# Patient Record
Sex: Male | Born: 1950 | Race: White | Hispanic: No | Smoking: Former smoker
Health system: Southern US, Community
[De-identification: ages and names within clinical notes are randomized; demographics above are authoritative.]

## PROBLEM LIST (undated history)

## (undated) DIAGNOSIS — E785 Hyperlipidemia, unspecified: Secondary | ICD-10-CM

## (undated) DIAGNOSIS — G43909 Migraine, unspecified, not intractable, without status migrainosus: Secondary | ICD-10-CM

## (undated) DIAGNOSIS — K2100 Gastro-esophageal reflux disease with esophagitis, without bleeding: Secondary | ICD-10-CM

## (undated) DIAGNOSIS — I77811 Abdominal aortic ectasia: Secondary | ICD-10-CM

## (undated) HISTORY — PX: SHOULDER SURGERY: SHX246

## (undated) HISTORY — DX: Gastro-esophageal reflux disease with esophagitis: K21.0

## (undated) HISTORY — PX: HERNIA REPAIR: SHX51

## (undated) HISTORY — PX: BACK SURGERY: SHX140

## (undated) HISTORY — DX: Gastro-esophageal reflux disease with esophagitis, without bleeding: K21.00

## (undated) HISTORY — DX: Migraine, unspecified, not intractable, without status migrainosus: G43.909

## (undated) HISTORY — PX: LAMINECTOMY: SHX219

## (undated) HISTORY — DX: Hyperlipidemia, unspecified: E78.5

---
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
## 2002-07-22 ENCOUNTER — Encounter: Payer: Self-pay | Admitting: Pediatrics

## 2002-07-22 ENCOUNTER — Ambulatory Visit (HOSPITAL_COMMUNITY): Admission: RE | Admit: 2002-07-22 | Discharge: 2002-07-22 | Payer: Self-pay | Admitting: Pediatrics

## 2005-04-23 HISTORY — PX: COLONOSCOPY: SHX174

## 2006-02-18 ENCOUNTER — Ambulatory Visit (HOSPITAL_COMMUNITY): Admission: RE | Admit: 2006-02-18 | Discharge: 2006-02-18 | Payer: Self-pay | Admitting: Pediatrics

## 2006-03-06 ENCOUNTER — Ambulatory Visit: Payer: Self-pay | Admitting: Internal Medicine

## 2006-03-06 ENCOUNTER — Ambulatory Visit (HOSPITAL_COMMUNITY): Admission: RE | Admit: 2006-03-06 | Discharge: 2006-03-06 | Payer: Self-pay | Admitting: Internal Medicine

## 2006-03-26 ENCOUNTER — Encounter: Admission: RE | Admit: 2006-03-26 | Discharge: 2006-03-26 | Payer: Self-pay | Admitting: Neurosurgery

## 2006-03-28 ENCOUNTER — Encounter: Admission: RE | Admit: 2006-03-28 | Discharge: 2006-03-28 | Payer: Self-pay | Admitting: Neurosurgery

## 2006-04-18 ENCOUNTER — Encounter: Admission: RE | Admit: 2006-04-18 | Discharge: 2006-04-18 | Payer: Self-pay | Admitting: Neurosurgery

## 2006-07-05 ENCOUNTER — Encounter: Admission: RE | Admit: 2006-07-05 | Discharge: 2006-07-05 | Payer: Self-pay | Admitting: Neurosurgery

## 2006-09-24 ENCOUNTER — Encounter: Admission: RE | Admit: 2006-09-24 | Discharge: 2006-09-24 | Payer: Self-pay | Admitting: Neurosurgery

## 2006-10-14 ENCOUNTER — Encounter: Admission: RE | Admit: 2006-10-14 | Discharge: 2006-10-14 | Payer: Self-pay | Admitting: Neurosurgery

## 2007-02-07 ENCOUNTER — Encounter: Admission: RE | Admit: 2007-02-07 | Discharge: 2007-02-07 | Payer: Self-pay | Admitting: Neurosurgery

## 2007-04-22 ENCOUNTER — Encounter: Admission: RE | Admit: 2007-04-22 | Discharge: 2007-04-22 | Payer: Self-pay | Admitting: Neurosurgery

## 2008-10-28 ENCOUNTER — Encounter: Admission: RE | Admit: 2008-10-28 | Discharge: 2008-10-28 | Payer: Self-pay | Admitting: Chiropractic Medicine

## 2008-11-24 ENCOUNTER — Ambulatory Visit (HOSPITAL_COMMUNITY): Admission: RE | Admit: 2008-11-24 | Discharge: 2008-11-25 | Payer: Self-pay | Admitting: Neurosurgery

## 2009-04-23 HISTORY — PX: BACK SURGERY: SHX140

## 2010-05-14 ENCOUNTER — Encounter: Payer: Self-pay | Admitting: Neurosurgery

## 2010-07-30 LAB — CBC
HCT: 39.4 % (ref 39.0–52.0)
Hemoglobin: 13.4 g/dL (ref 13.0–17.0)
MCV: 90.9 fL (ref 78.0–100.0)
Platelets: 235 10*3/uL (ref 150–400)
RBC: 4.33 MIL/uL (ref 4.22–5.81)
WBC: 11 10*3/uL — ABNORMAL HIGH (ref 4.0–10.5)

## 2010-07-30 LAB — BASIC METABOLIC PANEL
BUN: 16 mg/dL (ref 6–23)
Chloride: 102 mEq/L (ref 96–112)
GFR calc non Af Amer: >60 mL/min (ref >60)
Potassium: 3.5 mEq/L (ref 3.5–5.1)
Sodium: 139 mEq/L (ref 135–145)

## 2010-09-05 NOTE — H&P (Signed)
NAME:  SILAS, MUFF                 ACCOUNT NO.:  [id number]   MEDICAL RECORD NO.:  [id number]          PATIENT TYPE:  OIB   LOCATION:  3537                         FACILITY:  [facility]   PHYSICIAN:  Hilda Lias, M.D.   DATE OF BIRTH:  Aug 16, 1950   DATE OF ADMISSION:  11/24/2008  DATE OF DISCHARGE:                              HISTORY & PHYSICAL   HISTORY OF PRESENT ILLNESS:  Mr. Vanatta is a gentleman who came to my  office with his wife complaining of back pain with radiation down to the  left leg, which is getting worse despite conservative treatment, which  includes episode of injection and medication including p.o. steroid.  The patient in the past had problem with the right leg.  In view of no  improvement, he had an MRI, and he wanted to proceed with surgery.   PAST MEDICAL HISTORY:  Left inguinal hernia repair and shoulder surgery.   ALLERGIES:  He is allergic to PENICILLIN.   SOCIAL HISTORY:  The patient does not smoke.  He drinks socially.   REVIEW OF SYSTEMS:  Positive for high cholesterol and left leg pain.   PHYSICAL EXAMINATION:  GENERAL:  The patient came to my office, limping  from the left leg.  He had difficulty standing.  He wanted to have  surgery almost immediately.  HEAD, EARS, NOSE, AND THROAT:  Normal.  NECK:  Normal.  LUNGS:  Clear.  HEART:  Heart sounds normal.  ABDOMEN:  Normal.  EXTREMITIES:  Normal pulses.  NEUROLOGIC:  Mental status normal.  Cranial nerves normal.  He has  weakness with dorsiflexion of the left foot.  The face is symmetrical.  Straight leg raising is positive at 30 degrees from the left side.   The MRI showed that he has a herniated disk at the level of L4-5 to the  left.  He also has facet arthropathy, L3-4, L4-5, and L5-S1 with some  degenerative disk disease.   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  Degenerative disk disease.   RECOMMENDATIONS:  The patient being admitted for surgery.  The  procedure  will be left L4-L5 diskectomy.  The patient knows about the risk such as  infection, CSF leak, worsening pain, and need for further surgery.           ______________________________  Hilda Lias, M.D.     EB/MEDQ  D:  11/24/2008  T:  11/25/2008  Job:  [id number]

## 2010-09-05 NOTE — Op Note (Signed)
NAME:  Dylan Chapman, Dylan Chapman                 ACCOUNT NO.:  [id number]   MEDICAL RECORD NO.:  [id number]          PATIENT TYPE:  OIB   LOCATION:  3537                         FACILITY:  [facility]   PHYSICIAN:  Hilda Lias, M.D.   DATE OF BIRTH:  [birth year]   DATE OF PROCEDURE:  11/24/2008  DATE OF DISCHARGE:                               OPERATIVE REPORT   PREOPERATIVE DIAGNOSIS:  Left L4-L5 herniated disk.   FINAL DIAGNOSIS:  Left L4-L5 herniated disk with free fragment.   PROCEDURE:  Left L4-L5 laminotomy, removal of 5 free fragments,  decompression of the L5 and L4 nerve root, microscope.   SURGEON:  Hilda Lias, MD   ASSISTANT:  Coletta Memos, MD   CLINICAL HISTORY:  [name] is a __________ who came to my office  complaining of back pain with radiation to the left leg.  The patient  was getting worse.  He failed with conservative treatment including  epidural injection.  MRI showed fragment compromising the L5 nerve root.  Surgery was advised.   PROCEDURE:  The patient was taken to the OR, after intubation, he was  positioned in a prone manner.  The back was cleaned with DuraPrep.  A  midline incision from L4 to L5 was made and muscles were retracted in  the left side.  The x-ray showed that indeed we were at the level L4-5.  Then with the drill and use of the microscope, we drilled the lower  lamina of L4 and above L5.  A thick yellow ligament was also removed.  Immediately, we found that the L5 nerve root was adherent to the floor,  swelling and red.  Dissection was done and immediately, right  underneath, there were at least 5 fragments of this prior to take off of  L5 nerve root.  We found a small area where the disk was opened.  We  __________ use of bipolar to harden the area.  Foraminotomy was  accomplished.  At the end, we have plain roof of the L5 and L4 nerve  root.  No diskectomy was needed.  Then, Valsalva maneuver was negative.  Fentanyl and Depo-Medrol were left in  the epidural space, and the wound  was closed with Vicryl and Steri-Strips.           ______________________________  Hilda Lias, M.D.     EB/MEDQ  D:  11/24/2008  T:  11/25/2008  Job:  [id number]

## 2010-09-08 NOTE — Op Note (Signed)
NAME:  Dylan Chapman, Dylan Chapman                 ACCOUNT NO.:  [id number]   MEDICAL RECORD NO.:  [id number]          PATIENT TYPE:  AMB   LOCATION:  DAY                           FACILITY:  [facility]   PHYSICIAN:  R. Roetta Sessions, M.D. DATE OF BIRTH:  [birth year]   DATE OF PROCEDURE:  03/06/2006  DATE OF DISCHARGE:                                 OPERATIVE REPORT   PROCEDURE:  Screening colonoscopy.   INDICATIONS FOR PROCEDURE:  Patient is a 59 year old gentleman sent over per  the courtesy of Dr. Acey Lav for colorectal cancer screening via  colonoscopy.  He is devoid of any lower GI tract symptoms.  He has never had  his colon imaged previously, and there is no family history of colorectal  neoplasia.  Colonoscopy is now being done.  This approach has been discussed  with the patient at length.  Potential risks, benefits and alternatives have  been reviewed and questions answered.  He is agreeable.  Please see  documentation on the medical record.   PROCEDURE NOTE:  O2 saturation, blood pressure, pulses, and respirations  were monitored throughout the entire procedure.  Conscious sedation with  Versed 4 mg IV and Demerol 75 mg in divided doses.   INSTRUMENT:  Olympus video chip system.   FINDINGS:  Digital rectal exam revealed no abnormalities.   ENDOSCOPIC FINDINGS:  Prep was good.   RECTAL:  Examination of the rectal mucosa, including retroflexion of the  anal verge, revealed no abnormalities.   COLON:  The colonic mucosa was surveyed from the rectosigmoid junction to  the left transverse, right colon, the appendiceal orifice, the ileocecal  valve, and cecum.  These structures were well seen and photographed for the  record.  From this level, the scope was slowly withdrawn and all previously  mentioned mucosal surfaces were again seen.  The patient had scattered  sigmoid diverticulum, and colonic mucosa appeared normal.  The patient  tolerated the procedure well and was reactive.   ENDOSCOPY IMPRESSION:  1. Normal rectum.  2. Scattered sigmoid diverticulum.  Colonic mucosa appeared normal.   RECOMMENDATIONS:  1. Diverticulosis literature provided to [name].  2. Repeat screening colonoscopy 10 years.      [name], M.D.  Electronically Signed     RMR/MEDQ  D:  03/06/2006  T:  03/06/2006  Job:  [id number]   cc:   Francoise Schaumann. Milford Cage DO, FAAP  Fax: [phone]

## 2012-06-28 ENCOUNTER — Encounter: Payer: Self-pay | Admitting: *Deleted

## 2013-02-18 ENCOUNTER — Telehealth: Payer: Self-pay | Admitting: Family Medicine

## 2013-02-18 NOTE — Telephone Encounter (Signed)
Dylan Chapman has a medical question for you if you could give him a call at your soonest availability.

## 2013-02-19 ENCOUNTER — Telehealth: Payer: Self-pay | Admitting: General Practice

## 2013-02-19 NOTE — Telephone Encounter (Signed)
Message copied by Jennings Books on Thu Feb 19, 2013  8:33 AM ------      Message from: Diana Eves D      Created: Wed Feb 18, 2013  3:22 PM       Pt called to see how much a TCS would be since he know longer has insurance. He isn't due until 02/2016 but he was asking also if there was any financial assistance or payment plan available. I gave him the number to the billing office, but also said if he would like to apply for [facility] Assistance that I would have my office manager get back with him. Please call him at [phone] ------

## 2013-02-19 NOTE — Telephone Encounter (Signed)
I also told the patient that his next tcs was not due until 02/2016.  He thanked me for my time and stated he will call me if he needed anything.

## 2013-02-19 NOTE — Telephone Encounter (Signed)
I spoke with Dylan Chapman concerning pricing for colonoscopy.

## 2013-02-20 NOTE — Telephone Encounter (Signed)
Patient wondered if it was safe to wait to 2017 for next colonoscopy. Records were reviewed last colonoscopy 2007 and it does state followup colonoscopy 10 years later. Therefore colonoscopy in 2017 patient is aware of this and agrees. Patient not having any problems currently

## 2013-07-22 ENCOUNTER — Other Ambulatory Visit: Payer: Self-pay | Admitting: Family Medicine

## 2013-07-22 ENCOUNTER — Other Ambulatory Visit: Payer: Self-pay | Admitting: *Deleted

## 2013-07-22 ENCOUNTER — Telehealth: Payer: Self-pay | Admitting: Family Medicine

## 2013-07-22 DIAGNOSIS — E785 Hyperlipidemia, unspecified: Secondary | ICD-10-CM

## 2013-07-22 DIAGNOSIS — Z125 Encounter for screening for malignant neoplasm of prostate: Secondary | ICD-10-CM

## 2013-07-22 DIAGNOSIS — Z79899 Other long term (current) drug therapy: Secondary | ICD-10-CM

## 2013-07-22 NOTE — Telephone Encounter (Signed)
Notified patient stating blood work orders are in.

## 2013-07-22 NOTE — Telephone Encounter (Signed)
Medication sent patient notified. 

## 2013-07-22 NOTE — Addendum Note (Signed)
Addended byCharolotte Capuchin D on: 07/22/2013 04:25 PM   Modules accepted: Orders

## 2013-07-22 NOTE — Telephone Encounter (Signed)
[facility] patient. Has not been seen in over a year.   The medication is:                           KETO/RIBOF/CAFF 12.5/100/    #60    As directed

## 2013-07-22 NOTE — Telephone Encounter (Signed)
Ok times one 

## 2013-07-22 NOTE — Telephone Encounter (Signed)
Patient would like orders for BW before his appt on 4/23

## 2013-07-22 NOTE — Telephone Encounter (Signed)
Patient needs new prescription on compund that he gets for headaches from [facility] prescription number is [phone] patient has appointment to be seen on 4/23 that the first available apointment that dr. [name] has for medication check. [place]

## 2013-07-22 NOTE — Telephone Encounter (Signed)
Lip liv m7 psa 

## 2013-07-24 LAB — LIPID PANEL
CHOL/HDL RATIO: 3.2 ratio
CHOLESTEROL: 185 mg/dL (ref 0–200)
HDL: 58 mg/dL (ref >39)
LDL Cholesterol: 112 mg/dL — ABNORMAL HIGH (ref 0–99)
TRIGLYCERIDES: 73 mg/dL (ref <150)
VLDL: 15 mg/dL (ref 0–40)

## 2013-07-24 LAB — BASIC METABOLIC PANEL
BUN: 23 mg/dL (ref 6–23)
CHLORIDE: 104 meq/L (ref 96–112)
CO2: 27 mEq/L (ref 19–32)
CREATININE: 1.26 mg/dL (ref 0.50–1.35)
Calcium: 9.6 mg/dL (ref 8.4–10.5)
Glucose, Bld: 100 mg/dL — ABNORMAL HIGH (ref 70–99)
Potassium: 4.8 mEq/L (ref 3.5–5.3)
Sodium: 141 mEq/L (ref 135–145)

## 2013-07-24 LAB — HEPATIC FUNCTION PANEL
ALT: 13 U/L (ref 0–53)
AST: 16 U/L (ref 0–37)
Albumin: 4.2 g/dL (ref 3.5–5.2)
Alkaline Phosphatase: 64 U/L (ref 39–117)
BILIRUBIN DIRECT: 0.1 mg/dL (ref 0.0–0.3)
BILIRUBIN TOTAL: 0.8 mg/dL (ref 0.2–1.2)
Indirect Bilirubin: 0.7 mg/dL (ref 0.2–1.2)
Total Protein: 6.8 g/dL (ref 6.0–8.3)

## 2013-07-25 LAB — PSA: PSA: 2.18 ng/mL (ref <=4.00)

## 2013-08-13 ENCOUNTER — Encounter: Payer: Self-pay | Admitting: Family Medicine

## 2013-08-13 ENCOUNTER — Ambulatory Visit (INDEPENDENT_AMBULATORY_CARE_PROVIDER_SITE_OTHER): Payer: Self-pay | Admitting: Family Medicine

## 2013-08-13 VITALS — BP 118/82 | Ht 66.5 in | Wt 157.0 lb

## 2013-08-13 DIAGNOSIS — R7309 Other abnormal glucose: Secondary | ICD-10-CM

## 2013-08-13 DIAGNOSIS — M549 Dorsalgia, unspecified: Secondary | ICD-10-CM

## 2013-08-13 DIAGNOSIS — R739 Hyperglycemia, unspecified: Secondary | ICD-10-CM

## 2013-08-13 LAB — POCT GLYCOSYLATED HEMOGLOBIN (HGB A1C): HEMOGLOBIN A1C: 5.2

## 2013-08-13 MED ORDER — SIMVASTATIN 40 MG PO TABS: 40.0000 mg | ORAL_TABLET | Freq: Every evening | ORAL | Status: DC

## 2013-08-13 NOTE — Progress Notes (Signed)
   Subjective:    Patient ID: Dylan Chapman, male    DOB: 05-24-1950, 63 y.o.   MRN: [id number]  HPI  Patient arrives for a yearly physical. Patient states he has flares of back pain at times. The patient has intermittent flares of back pain does not radiate down the leg no severe sciatica with it no loss of muscle control. Denies weakness numbness. Denies chest tightness pressure pain shortness breath with activity Wife recently diagnosed with cancer they're going through difficult time Denies hematuria. Denies joint pain. Occasionally he gets cramps in his legs. Review of Systems  Constitutional: Negative for fever, activity change and appetite change.  HENT: Negative for congestion and rhinorrhea.   Eyes: Negative for discharge.  Respiratory: Negative for cough and wheezing.   Cardiovascular: Negative for chest pain.  Gastrointestinal: Negative for vomiting, abdominal pain and blood in stool.  Genitourinary: Negative for frequency and difficulty urinating.  Musculoskeletal: Positive for back pain. Negative for neck pain.  Skin: Negative for rash.  Allergic/Immunologic: Negative for environmental allergies and food allergies.  Neurological: Negative for weakness and headaches.  Psychiatric/Behavioral: Negative for agitation.       Objective:   Physical Exam  Constitutional: He appears well-developed and well-nourished.  HENT:  Head: Normocephalic and atraumatic.  Right Ear: External ear normal.  Left Ear: External ear normal.  Nose: Nose normal.  Mouth/Throat: Oropharynx is clear and moist.  Eyes: EOM are normal. Pupils are equal, round, and reactive to light.  Neck: Normal range of motion. Neck supple. No thyromegaly present.  Cardiovascular: Normal rate, regular rhythm and normal heart sounds.   No murmur heard. Pulmonary/Chest: Effort normal and breath sounds normal. No respiratory distress. He has no wheezes.  Abdominal: Soft. Bowel sounds are normal. He exhibits no  distension and no mass. There is no tenderness.  Genitourinary: Penis normal.  Musculoskeletal: Normal range of motion. He exhibits no edema.  Lymphadenopathy:    He has no cervical adenopathy.  Neurological: He is alert. He exhibits normal muscle tone.  Skin: Skin is warm and dry. No erythema.  Psychiatric: He has a normal mood and affect. His behavior is normal. Judgment normal.          Assessment & Plan:  The patient comes in today for a wellness visit.  A review of their health history was completed.  A review of medications was also completed. Any necessary refills were discussed. Sensible healthy diet was discussed. Importance of minimizing excessive salt and carbohydrates was also discussed. Safety was stressed including driving, activities at work and at home where applicable. Importance of regular physical activity for overall health was discussed. Preventative measures appropriate for age were discussed. Time was spent with the patient discussing any concerns they have about their well-being. Back stretches, ibuprofen when necessary no x-rays indicated. Recent lab work including metabolic 7 as well as PSA looks good Not due for colonoscopy currently until November 2017

## 2013-08-24 ENCOUNTER — Encounter: Payer: Self-pay | Admitting: Family Medicine

## 2013-08-24 ENCOUNTER — Ambulatory Visit (INDEPENDENT_AMBULATORY_CARE_PROVIDER_SITE_OTHER): Payer: Self-pay | Admitting: Family Medicine

## 2013-08-24 VITALS — BP 140/90 | Ht 66.5 in | Wt 154.0 lb

## 2013-08-24 DIAGNOSIS — J069 Acute upper respiratory infection, unspecified: Secondary | ICD-10-CM

## 2013-08-24 DIAGNOSIS — J029 Acute pharyngitis, unspecified: Secondary | ICD-10-CM

## 2013-08-24 LAB — POCT RAPID STREP A (OFFICE): Rapid Strep A Screen: NEGATIVE

## 2013-08-24 MED ORDER — SULFAMETHOXAZOLE-TMP DS 800-160 MG PO TABS
1.0000 | ORAL_TABLET | Freq: Two times a day (BID) | ORAL | Status: DC
Start: 2013-08-24 — End: 2014-03-11

## 2013-08-24 NOTE — Progress Notes (Signed)
   Subjective:    Patient ID: Dylan Chapman, male    DOB: [birth year], 63 y.o.   MRN: [id number]  Sinusitis This is a new problem. Episode onset: Wed. Associated symptoms include congestion, coughing, headaches, sinus pressure and a sore throat. Past treatments include nothing.    Had headache  /cluster headache, took Inderal it helped Review of Systems  HENT: Positive for congestion, sinus pressure and sore throat.   Respiratory: Positive for cough.   Neurological: Positive for headaches.       Objective:   Physical Exam Throat minimal erythema neck is supple lungs are clear sinus nontender no crackles       Assessment & Plan:  Rapid strep negative Viral process supportive measures discussed. Followup if progressive troubles prescription for Bactrim given may use if symptoms worsen warning signs and what to watch for was discussed  Patient does need a refill on Inderal he's not sure the dose he will forward that dose to [place] then we will call in prescription think

## 2013-08-25 LAB — STREP A DNA PROBE: GASP: NEGATIVE

## 2013-08-26 ENCOUNTER — Other Ambulatory Visit: Payer: Self-pay | Admitting: Family Medicine

## 2013-08-26 MED ORDER — PROPRANOLOL HCL 80 MG PO TABS: 80.0000 mg | ORAL_TABLET | Freq: Three times a day (TID) | ORAL | Status: DC

## 2013-08-26 NOTE — Progress Notes (Signed)
Pt requested refill, relates uses it for cluster headaches

## 2013-11-04 ENCOUNTER — Other Ambulatory Visit: Payer: Self-pay | Admitting: Family Medicine

## 2013-11-04 NOTE — Telephone Encounter (Signed)
Not on med list. Last seen 5/4

## 2013-11-04 NOTE — Telephone Encounter (Signed)
May refill x4 

## 2013-11-14 ENCOUNTER — Other Ambulatory Visit: Payer: Self-pay | Admitting: Family Medicine

## 2014-03-11 ENCOUNTER — Ambulatory Visit (INDEPENDENT_AMBULATORY_CARE_PROVIDER_SITE_OTHER): Payer: Self-pay | Admitting: Family Medicine

## 2014-03-11 ENCOUNTER — Encounter: Payer: Self-pay | Admitting: Family Medicine

## 2014-03-11 VITALS — BP 138/90 | Temp 98.6°F | Ht 66.5 in | Wt 165.0 lb

## 2014-03-11 DIAGNOSIS — M545 Low back pain: Secondary | ICD-10-CM

## 2014-03-11 MED ORDER — CHLORZOXAZONE 500 MG PO TABS: 500.0000 mg | ORAL_TABLET | Freq: Four times a day (QID) | ORAL | Status: DC | PRN

## 2014-03-11 NOTE — Progress Notes (Signed)
   Subjective:    Patient ID: Dylan Chapman, male    DOB: [birth year], 63 y.o.   MRN: [id number]  Back Pain This is a recurrent problem. The current episode started more than 1 year ago. The pain is present in the thoracic spine. Quality: spasm. The pain does not radiate. The symptoms are aggravated by position, sitting and twisting. He has tried NSAIDs for the symptoms. The treatment provided no relief.   No sciatica Very tender Trigger- some lifting last week, hand truck lifting 2 days ago Worse with certain movements Hx lumbar surgery Patient relates that the pain is sometimes bad enough to cause him the buckle at the knees when he tries to move  He has had some intermittent numbness into the feet but denies any loss of control or straining  Review of Systems  Musculoskeletal: Positive for back pain.       Objective:   Physical Exam  Lumbar pain on physical exam slight increased pain in the back with straight leg raise on the right none on the left strengthen legs good reflexes good range of motion subpar      Assessment & Plan:  May need steroids  if need hydrocodone he will call  May use anti-inflammatory over the course of next several days, muscle relaxers as necessary cautioned drowsiness, range of motion exercises shown. No need for MRI or scan this time

## 2014-03-17 ENCOUNTER — Telehealth: Payer: Self-pay | Admitting: Family Medicine

## 2014-03-17 MED ORDER — PREDNISONE 20 MG PO TABS: ORAL_TABLET | ORAL | Status: DC

## 2014-03-17 NOTE — Telephone Encounter (Signed)
Ok adult pred taper 

## 2014-03-17 NOTE — Telephone Encounter (Signed)
Patient notified

## 2014-03-17 NOTE — Telephone Encounter (Signed)
Pt calling to say his back is not better, he states that Dr [name] said that he  Was to call back there is a standing order in his OV from 11/19 that says he will call in a  Steroids    [facility]

## 2014-05-27 ENCOUNTER — Ambulatory Visit (INDEPENDENT_AMBULATORY_CARE_PROVIDER_SITE_OTHER): Payer: Self-pay | Admitting: Family Medicine

## 2014-05-27 ENCOUNTER — Encounter: Payer: Self-pay | Admitting: Family Medicine

## 2014-05-27 VITALS — BP 104/72 | Temp 98.6°F | Ht 66.5 in | Wt 160.4 lb

## 2014-05-27 DIAGNOSIS — J208 Acute bronchitis due to other specified organisms: Secondary | ICD-10-CM

## 2014-05-27 MED ORDER — AZITHROMYCIN 250 MG PO TABS: ORAL_TABLET | ORAL | Status: DC

## 2014-05-27 NOTE — Progress Notes (Signed)
   Subjective:    Patient ID: Dylan Chapman, male    DOB: [birth year], 64 y.o.   MRN: [id number]  URI  This is a new problem. The current episode started 1 to 4 weeks ago. The problem has been unchanged. There has been no fever. Associated symptoms include congestion, coughing, a sore throat and wheezing. He has tried decongestant for the symptoms. The treatment provided no relief.   Patient states that he has no other concerns at this time.  PMH benign  Review of Systems  HENT: Positive for congestion and sore throat.   Respiratory: Positive for cough and wheezing.    see below     Objective:   Physical Exam Lungs are clear except when he does extended exhalation he does have some wheezing with it. He denies any high fever or chills. Denies nausea vomiting.       Assessment & Plan:  Persistent bronchitis-it is getting better compared where was encouraged him to give it another week or 2 a prescription for Zithromax given in case it does get worse over the next week he can get that filled. No need for any x-rays or lab work currently.  He will do his wellness later this year and do his lab work beforehand

## 2014-06-10 ENCOUNTER — Other Ambulatory Visit: Payer: Self-pay | Admitting: Family Medicine

## 2014-06-10 MED ORDER — SULFAMETHOXAZOLE-TRIMETHOPRIM 800-160 MG PO TABS: 1.0000 | ORAL_TABLET | Freq: Two times a day (BID) | ORAL | Status: DC

## 2014-06-10 NOTE — Progress Notes (Signed)
The patient called stating that he was still having intermittent chest congestion coughing sometimes bringing up clear phlegm sometimes discolored phlegm this is been going on for several weeks he is noticed only some improvement compared to when he was treated with Zithromax 14 days ago. I have informed the patient that I thought it was best for him to go ahead and do a round of antibiotics. If he has any signs of an allergic reaction to the medicine he is to stop it. He is also to contact us in 2 weeks if not doing better. I do not feel the patient needs to do a chest x-ray currently but that could possibly need to be done depending on what  Goes on.

## 2014-08-09 ENCOUNTER — Telehealth: Payer: Self-pay | Admitting: Family Medicine

## 2014-08-09 DIAGNOSIS — Z79899 Other long term (current) drug therapy: Secondary | ICD-10-CM

## 2014-08-09 DIAGNOSIS — E785 Hyperlipidemia, unspecified: Secondary | ICD-10-CM

## 2014-08-09 DIAGNOSIS — Z125 Encounter for screening for malignant neoplasm of prostate: Secondary | ICD-10-CM

## 2014-08-09 NOTE — Telephone Encounter (Signed)
Pt called requesting lab orders to be sent over. Last labs per epic were: Hepatic,lipid,bmp,and psa on 07/22/13

## 2014-08-09 NOTE — Telephone Encounter (Signed)
Expand All Collapse All   Pt called requesting lab orders to be sent over. Last labs per epic were: Hepatic,lipid,bmp,and psa on 07/22/13

## 2014-08-09 NOTE — Telephone Encounter (Signed)
Lipid, liver, metabolic 7, PSA. These are the standard test. If he has anything else going on to let us know we will order additional tests.

## 2014-08-10 NOTE — Telephone Encounter (Signed)
Blood work orders placed in Epic. Patient notified. 

## 2014-08-12 LAB — BASIC METABOLIC PANEL
BUN / CREAT RATIO: 20 (ref 10–22)
BUN: 23 mg/dL (ref 8–27)
CO2: 25 mmol/L (ref 18–29)
CREATININE: 1.14 mg/dL (ref 0.76–1.27)
Calcium: 9.5 mg/dL (ref 8.6–10.2)
Chloride: 105 mmol/L (ref 97–108)
GFR, EST AFRICAN AMERICAN: 79 mL/min/{1.73_m2} (ref >59)
GFR, EST NON AFRICAN AMERICAN: 69 mL/min/{1.73_m2} (ref >59)
GLUCOSE: 99 mg/dL (ref 65–99)
Potassium: 5 mmol/L (ref 3.5–5.2)
Sodium: 144 mmol/L (ref 134–144)

## 2014-08-12 LAB — LIPID PANEL
CHOLESTEROL TOTAL: 193 mg/dL (ref 100–199)
Chol/HDL Ratio: 3 ratio units (ref 0.0–5.0)
HDL: 64 mg/dL (ref >39)
LDL CALC: 116 mg/dL — AB (ref 0–99)
TRIGLYCERIDES: 63 mg/dL (ref 0–149)
VLDL Cholesterol Cal: 13 mg/dL (ref 5–40)

## 2014-08-12 LAB — HEPATIC FUNCTION PANEL
ALBUMIN: 4.3 g/dL (ref 3.6–4.8)
ALT: 13 IU/L (ref 0–44)
AST: 20 IU/L (ref 0–40)
Alkaline Phosphatase: 70 IU/L (ref 39–117)
BILIRUBIN TOTAL: 0.5 mg/dL (ref 0.0–1.2)
BILIRUBIN, DIRECT: 0.14 mg/dL (ref 0.00–0.40)
TOTAL PROTEIN: 6.6 g/dL (ref 6.0–8.5)

## 2014-08-12 LAB — PSA: PSA: 2.9 ng/mL (ref 0.0–4.0)

## 2014-08-18 ENCOUNTER — Encounter: Payer: Self-pay | Admitting: Family Medicine

## 2014-08-18 ENCOUNTER — Ambulatory Visit (INDEPENDENT_AMBULATORY_CARE_PROVIDER_SITE_OTHER): Payer: Self-pay | Admitting: Family Medicine

## 2014-08-18 VITALS — BP 128/70 | Ht 66.5 in | Wt 159.0 lb

## 2014-08-18 DIAGNOSIS — Z0000 Encounter for general adult medical examination without abnormal findings: Secondary | ICD-10-CM

## 2014-08-18 DIAGNOSIS — R05 Cough: Secondary | ICD-10-CM

## 2014-08-18 DIAGNOSIS — R059 Cough, unspecified: Secondary | ICD-10-CM

## 2014-08-18 MED ORDER — SIMVASTATIN 80 MG PO TABS: ORAL_TABLET | ORAL | Status: DC

## 2014-08-18 NOTE — Progress Notes (Signed)
   Subjective:    Patient ID: Dylan Chapman, male    DOB: 08/29/1950, 64 y.o.   MRN: [id number]  HPI The patient comes in today for a wellness visit.    A review of their health history was completed.  A review of medications was also completed.  Any needed refills: none  Eating habits: decent  Falls/  MVA accidents in past few months: none  Regular exercise: stretching exercises and walking 6 days a week   Specialist pt sees on regular basis: none  Preventative health issues were discussed.   Additional concerns: Talk about tetanus and shingles vaccine. Patient has congestion in his lungs that has been present for a while now.    Review of Systems  Constitutional: Negative for fever, activity change and appetite change.  HENT: Negative for congestion and rhinorrhea.   Eyes: Negative for discharge.  Respiratory: Negative for cough and wheezing.   Cardiovascular: Negative for chest pain.  Gastrointestinal: Negative for vomiting, abdominal pain and blood in stool.  Genitourinary: Negative for frequency and difficulty urinating.  Musculoskeletal: Negative for neck pain.  Skin: Negative for rash.  Allergic/Immunologic: Negative for environmental allergies and food allergies.  Neurological: Negative for weakness and headaches.  Psychiatric/Behavioral: Negative for agitation.       Objective:   Physical Exam  Constitutional: He appears well-developed and well-nourished.  HENT:  Head: Normocephalic and atraumatic.  Right Ear: External ear normal.  Left Ear: External ear normal.  Nose: Nose normal.  Mouth/Throat: Oropharynx is clear and moist.  Eyes: EOM are normal. Pupils are equal, round, and reactive to light.  Neck: Normal range of motion. Neck supple. No thyromegaly present.  Cardiovascular: Normal rate, regular rhythm and normal heart sounds.   No murmur heard. Pulmonary/Chest: Effort normal and breath sounds normal. No respiratory distress. He has no wheezes.    Abdominal: Soft. Bowel sounds are normal. He exhibits no distension and no mass. There is no tenderness.  Genitourinary: Penis normal.  Musculoskeletal: Normal range of motion. He exhibits no edema.  Lymphadenopathy:    He has no cervical adenopathy.  Neurological: He is alert. He exhibits normal muscle tone.  Skin: Skin is warm and dry. No erythema.  Psychiatric: He has a normal mood and affect. His behavior is normal. Judgment normal.   Headache issues under good control he uses a compound from local pharmacy [facility] uses it up to 3 times a week  ssafety dietary measures all discussed. Patient up-to-date on colonoscopy. No hematuria no chest tightness pressure pain shortness of breath  His lungs are clear but he has had some mild cough over the past several weeks since having a bronchitis but is gradually clearing up I would recommend if he starts having night sweats fevers chills weight loss for no good reason or progressive coughing a chest x-ray    Assessment & Plan:  Fatigue probably related to age increase exercise follow-up if ongoing troubles  Importance of regular physical activity watching diet discussed continue medication he takes a half of 80 mg Zocor  Recent lab work looks good repeated again in one years time  I did recommend tetanus shot and shingles vaccine he will probably get this through the pharmacy

## 2014-09-17 ENCOUNTER — Other Ambulatory Visit: Payer: Self-pay | Admitting: Family Medicine

## 2014-09-28 ENCOUNTER — Telehealth: Payer: Self-pay | Admitting: Family Medicine

## 2014-09-28 ENCOUNTER — Encounter: Payer: Self-pay | Admitting: *Deleted

## 2014-09-28 ENCOUNTER — Other Ambulatory Visit: Payer: Self-pay | Admitting: *Deleted

## 2014-09-28 NOTE — Telephone Encounter (Signed)
Ketoprofen 25 mg 1 twice a day, #60, 6 refills, [place], put on the prescription as a note to the pharmacist-to put the prescription on file until patient requests the prescription( it should be noted that I had a discussion with the patient was frequent headaches. He wanted to have this in and he will use it infrequently with caffeine and vitamin B2 for his migraines he is found this helps. He states he takes it anywhere between 1 and 2 times per week )

## 2014-09-28 NOTE — Telephone Encounter (Signed)
Unable to send through epic. Paper script sent to [facility]. Med added on med list.

## 2014-09-28 NOTE — Telephone Encounter (Signed)
Patient called in today and said he was given an e-mail to contact one of nurses about a medication but he misplaced the e-mail.  He says that Dr. [name] told him to contact the nurses this way for his Rx issue.  Please advise.

## 2015-07-05 ENCOUNTER — Telehealth: Payer: Self-pay | Admitting: Family Medicine

## 2015-07-05 DIAGNOSIS — Z131 Encounter for screening for diabetes mellitus: Secondary | ICD-10-CM

## 2015-07-05 DIAGNOSIS — E785 Hyperlipidemia, unspecified: Secondary | ICD-10-CM

## 2015-07-05 DIAGNOSIS — Z125 Encounter for screening for malignant neoplasm of prostate: Secondary | ICD-10-CM

## 2015-07-05 DIAGNOSIS — Z79899 Other long term (current) drug therapy: Secondary | ICD-10-CM

## 2015-07-05 DIAGNOSIS — R5383 Other fatigue: Secondary | ICD-10-CM

## 2015-07-05 NOTE — Telephone Encounter (Signed)
Lipid, liver, glucose, PSA, CBC

## 2015-07-05 NOTE — Telephone Encounter (Signed)
Patient has physical on 4/27 and needing lab papers. He is wanting to pick them up. He goes to [facility] which was [place].

## 2015-07-05 NOTE — Telephone Encounter (Signed)
Blood work ordered-orders up front for patient pick up. Patient notified.

## 2015-08-02 ENCOUNTER — Other Ambulatory Visit: Payer: Self-pay | Admitting: Family Medicine

## 2015-08-02 LAB — CBC WITH DIFFERENTIAL/PLATELET
BASOS ABS: 47 {cells}/uL (ref 0–200)
Basophils Relative: 1 %
Eosinophils Absolute: 141 cells/uL (ref 15–500)
Eosinophils Relative: 3 %
HCT: 43 % (ref 38.5–50.0)
HEMOGLOBIN: 14.3 g/dL (ref 13.2–17.1)
Lymphocytes Relative: 31 %
Lymphs Abs: 1457 cells/uL (ref 850–3900)
MCH: 29.4 pg (ref 27.0–33.0)
MCHC: 33.3 g/dL (ref 32.0–36.0)
MCV: 88.5 fL (ref 80.0–100.0)
MONO ABS: 423 {cells}/uL (ref 200–950)
MPV: 10.2 fL (ref 7.5–12.5)
Monocytes Relative: 9 %
NEUTROS PCT: 56 %
Neutro Abs: 2632 cells/uL (ref 1500–7800)
Platelets: 289 10*3/uL (ref 140–400)
RBC: 4.86 MIL/uL (ref 4.20–5.80)
RDW: 13.4 % (ref 11.0–15.0)
WBC: 4.7 10*3/uL (ref 3.8–10.8)

## 2015-08-02 LAB — HEPATIC FUNCTION PANEL
ALT: 11 U/L (ref 9–46)
AST: 18 U/L (ref 10–35)
Albumin: 4 g/dL (ref 3.6–5.1)
Alkaline Phosphatase: 56 U/L (ref 40–115)
BILIRUBIN INDIRECT: 0.5 mg/dL (ref 0.2–1.2)
BILIRUBIN TOTAL: 0.6 mg/dL (ref 0.2–1.2)
Bilirubin, Direct: 0.1 mg/dL (ref <=0.2)
Total Protein: 6.4 g/dL (ref 6.1–8.1)

## 2015-08-02 LAB — LIPID PANEL
Cholesterol: 168 mg/dL (ref 125–200)
HDL: 55 mg/dL (ref >=40)
LDL CALC: 101 mg/dL (ref <130)
Total CHOL/HDL Ratio: 3.1 Ratio (ref <=5.0)
Triglycerides: 61 mg/dL (ref <150)
VLDL: 12 mg/dL (ref <30)

## 2015-08-02 LAB — GLUCOSE, RANDOM: Glucose, Bld: 98 mg/dL (ref 65–99)

## 2015-08-03 LAB — PSA: PSA: 2.48 ng/mL (ref <=4.00)

## 2015-08-17 ENCOUNTER — Encounter: Payer: Self-pay | Admitting: Family Medicine

## 2015-08-18 ENCOUNTER — Encounter: Payer: Self-pay | Admitting: Family Medicine

## 2015-08-18 ENCOUNTER — Ambulatory Visit (INDEPENDENT_AMBULATORY_CARE_PROVIDER_SITE_OTHER): Payer: Self-pay | Admitting: Family Medicine

## 2015-08-18 VITALS — BP 122/74 | Ht 66.25 in | Wt 163.0 lb

## 2015-08-18 DIAGNOSIS — G8929 Other chronic pain: Secondary | ICD-10-CM | POA: Insufficient documentation

## 2015-08-18 DIAGNOSIS — E785 Hyperlipidemia, unspecified: Secondary | ICD-10-CM

## 2015-08-18 DIAGNOSIS — R519 Headache, unspecified: Secondary | ICD-10-CM | POA: Insufficient documentation

## 2015-08-18 DIAGNOSIS — Z0000 Encounter for general adult medical examination without abnormal findings: Secondary | ICD-10-CM

## 2015-08-18 DIAGNOSIS — R51 Headache: Secondary | ICD-10-CM

## 2015-08-18 MED ORDER — SIMVASTATIN 80 MG PO TABS: ORAL_TABLET | ORAL | Status: DC

## 2015-08-18 MED ORDER — TRIAMCINOLONE ACETONIDE 0.1 % EX CREA: 1.0000 "application " | TOPICAL_CREAM | Freq: Two times a day (BID) | CUTANEOUS | Status: DC

## 2015-08-18 NOTE — Progress Notes (Signed)
   Subjective:    Patient ID: Dylan Chapman, male    DOB: 1950-06-02, 65 y.o.   MRN: [id number]  HPI The patient comes in today for a wellness visit.    A review of their health history was completed.  A review of medications was also completed.  Any needed refills; simvastatin to [facility]. And compound for headaches done at [facility].    Eating habits: health conscious  Falls/  MVA accidents in past few months: none  Regular exercise: none  Specialist pt sees on regular basis: none  Preventative health issues were discussed.   Additional concerns: refill on triamcinolone cream. Last filled by DR. Ham. Still has tube but has expired.  No recent falls moods are good   Review of Systems    patient denies chest pains headaches nausea vomiting diarrhea rectal bleeding hematuria denies joint pain Objective:   Physical Exam Neck no masses skin looks good lungs are clear no crackles heart regular no murmurs pulse normal BP good abdomen soft prostate exam normal extremities no edema Cognitive function paced he passes mini cog. He can name 16 animals in 1 minute      Assessment & Plan:  Safety dietary measures discussed. The importance of eating healthy.  Hyperlipidemia lipid profile looks good he is taking a half of a simvastatin tablet daily  Chronic headaches stable uses a combination of ketoprofen as well as be vitamin complex through [facility]  Tolerates 81 mg aspirin daily denies a causing any trouble  Patient does have fungal infection of the toenails but this is normal for age it is not severe if he did choose Lamisil he would have to do liver profiles on a monthly basis patient defers on this currently-secondly I don't recommend the medication  Patient defers on colonoscopy which he will get it next year when he has Medicare. He denies any symptoms of colon cancer and currently does not have any insurance

## 2015-09-05 DIAGNOSIS — Z029 Encounter for administrative examinations, unspecified: Secondary | ICD-10-CM

## 2015-09-14 ENCOUNTER — Ambulatory Visit (INDEPENDENT_AMBULATORY_CARE_PROVIDER_SITE_OTHER): Payer: Self-pay | Admitting: Family Medicine

## 2015-09-14 ENCOUNTER — Encounter: Payer: Self-pay | Admitting: Family Medicine

## 2015-09-14 VITALS — BP 130/80 | Temp 98.6°F | Ht 66.25 in | Wt 162.2 lb

## 2015-09-14 DIAGNOSIS — J020 Streptococcal pharyngitis: Secondary | ICD-10-CM

## 2015-09-14 MED ORDER — AZITHROMYCIN 250 MG PO TABS: ORAL_TABLET | ORAL | Status: DC

## 2015-09-14 NOTE — Progress Notes (Signed)
   Subjective:    Patient ID: Dylan Chapman, male    DOB: 12/21/1950, 65 y.o.   MRN: [id number]  HPI Patient arrives with c/o sore throat- wife is currently being treated for strep and patient is needing treatment. Patient explores exposed to strep having some soreness in the throat denies high fever chills sweats no nausea vomiting diarrhea  Review of Systems     Objective:   Physical Exam Throat erythematous neck without masses lungs clear heart regular       Assessment & Plan:  Strep throat noted antibiotic prescribed warning signs discussed follow-up if problems

## 2016-02-06 ENCOUNTER — Telehealth: Payer: Self-pay | Admitting: Internal Medicine

## 2016-02-06 NOTE — Telephone Encounter (Signed)
Recall for tcs °

## 2016-02-06 NOTE — Telephone Encounter (Signed)
Letter mailed

## 2016-04-10 ENCOUNTER — Other Ambulatory Visit: Payer: Self-pay | Admitting: Family Medicine

## 2016-04-13 ENCOUNTER — Encounter: Payer: Self-pay | Admitting: Nurse Practitioner

## 2016-04-13 ENCOUNTER — Ambulatory Visit (INDEPENDENT_AMBULATORY_CARE_PROVIDER_SITE_OTHER): Payer: Self-pay | Admitting: Nurse Practitioner

## 2016-04-13 VITALS — BP 126/80 | Temp 98.3°F | Ht 66.25 in | Wt 170.1 lb

## 2016-04-13 DIAGNOSIS — J069 Acute upper respiratory infection, unspecified: Secondary | ICD-10-CM

## 2016-04-13 DIAGNOSIS — B9689 Other specified bacterial agents as the cause of diseases classified elsewhere: Secondary | ICD-10-CM

## 2016-04-13 MED ORDER — AZITHROMYCIN 250 MG PO TABS: ORAL_TABLET | ORAL | 0 refills | Status: DC

## 2016-04-14 ENCOUNTER — Encounter: Payer: Self-pay | Admitting: Nurse Practitioner

## 2016-04-14 NOTE — Progress Notes (Signed)
Subjective:  Presents for c/o sinus and cough x 3 d. No fever. Wife treated for similar illness yesterday. Sore throat. Headache. Runny nose. Frequent cough producing green sputum. No ear pain. No wheezing.   Objective:   BP 126/80   Temp 98.3 F (36.8 C) (Oral)   Ht 5' 6.25" (1.683 m)   Wt 170 lb 2 oz (77.2 kg)   BMI 27.25 kg/m  NAD. Alert, oriented. TMs clear effusion. Pharynx injected with PND noted. Neck supple with mild anterior adenopathy. Lungs clear. Heart RRR.   Assessment: Bacterial upper respiratory infection  Plan:  Meds ordered this encounter  Medications  . azithromycin (ZITHROMAX Z-PAK) 250 MG tablet    Sig: Take 2 tablets (500 mg) on  Day 1,  followed by 1 tablet (250 mg) once daily on Days 2 through 5.    Dispense:  6 each    Refill:  0    Order Specific Question:   Supervising Provider    Answer:   Mikey Kirschner [[id number]]   OTC meds as directed. Call back if worsens or persists.

## 2016-07-23 ENCOUNTER — Telehealth: Payer: Self-pay | Admitting: Family Medicine

## 2016-07-23 DIAGNOSIS — Z125 Encounter for screening for malignant neoplasm of prostate: Secondary | ICD-10-CM

## 2016-07-23 DIAGNOSIS — E785 Hyperlipidemia, unspecified: Secondary | ICD-10-CM

## 2016-07-23 NOTE — Telephone Encounter (Signed)
Lipid, liver, metabolic 7, PSA 

## 2016-07-23 NOTE — Telephone Encounter (Signed)
Diagnosis his hyperlipidemia and screening

## 2016-07-23 NOTE — Telephone Encounter (Signed)
Wants to have lab work done for his physical coming up in May. Going to [facility]. No hurry, please call when orders are ready.

## 2016-07-24 NOTE — Telephone Encounter (Signed)
Spoke with patient and informed him per [place] have been ordered for upcoming physical in May. Please be fasting prior to get labs drawn. Patient verbalized understanding.

## 2016-08-06 ENCOUNTER — Ambulatory Visit (INDEPENDENT_AMBULATORY_CARE_PROVIDER_SITE_OTHER): Payer: Self-pay | Admitting: Family Medicine

## 2016-08-06 ENCOUNTER — Encounter: Payer: Self-pay | Admitting: Family Medicine

## 2016-08-06 VITALS — Ht 66.25 in

## 2016-08-06 DIAGNOSIS — J029 Acute pharyngitis, unspecified: Secondary | ICD-10-CM

## 2016-08-06 DIAGNOSIS — R070 Pain in throat: Secondary | ICD-10-CM

## 2016-08-06 LAB — POCT RAPID STREP A (OFFICE): Rapid Strep A Screen: NEGATIVE

## 2016-08-06 NOTE — Progress Notes (Signed)
   Subjective:    Patient ID: Dylan Chapman, male    DOB: Oct 11, 1950, 66 y.o.   MRN: [id number]  Sore Throat   This is a new problem. The current episode started in the past 7 days. Associated symptoms comments: dysphagia. He has tried nothing for the symptoms.   No fever chills sweats no weight loss. Food does not get stuck but he feels a constant discomfort for the past week and a half and is had off and on discomfort in the laryngeal area for the past several weeks occasional hoarseness. Does not smoke or drink   Review of Systems Please see above no weight loss no night sweats. No fevers. No wheezing or difficulty breathing.    Objective:   Physical Exam Neck no masses eardrums normal throat minimal redness Rapid strep negative       Assessment & Plan:  Discomfort with swallowing Laryngeal discomfort Possible related to reflux issues-we will give the medication a two-week trial he still needs to see ENT for further evaluation if symptomatology is not improved with the medication there is concern that could be other issues going on. The patient is aware he needs to see ENT possibly for laryngoscope and or CAT scan Omeprazole 20 mg daily Check CBC Referral to ENT for further evaluation patient needs laryngeal examination possible CAT scans

## 2016-08-06 NOTE — Patient Instructions (Signed)
Omeprazole 20 mg , otc, one daily

## 2016-08-07 LAB — STREP A DNA PROBE: Strep Gp A Direct, DNA Probe: NEGATIVE

## 2016-08-13 ENCOUNTER — Encounter: Payer: Self-pay | Admitting: Family Medicine

## 2016-08-20 ENCOUNTER — Telehealth: Payer: Self-pay | Admitting: Family Medicine

## 2016-08-20 NOTE — Telephone Encounter (Signed)
Spoke with patient and patient stated that he has an appointment on May 17th with Dr.Teoh here is [place]. Stated that he is willing to move his appointment up but it depends of when it is so that it can work with his work schedule. Informed him per Dr.Giordano Luking to continue medications. Patient verbalized understanding.

## 2016-08-20 NOTE — Telephone Encounter (Signed)
The patient is concerned about whether or not omeprazole needs to be continued. Has appointment with ENT around the middle of the month. Nurse's-please find out from the patient is his appointment at the local ENT branch or in [place]? If there is a cancellation would you be willing to move this up? For now I recommend for the patient to continue the medication-it is helpful for the ENT to know that the patient has tried this.

## 2016-08-21 ENCOUNTER — Telehealth: Payer: Self-pay | Admitting: Family Medicine

## 2016-08-21 DIAGNOSIS — R070 Pain in throat: Secondary | ICD-10-CM

## 2016-08-21 DIAGNOSIS — M542 Cervicalgia: Secondary | ICD-10-CM

## 2016-08-21 NOTE — Telephone Encounter (Signed)
Please add CBC and TSH, free T4-diagnosis neck pain/throat pain

## 2016-08-21 NOTE — Telephone Encounter (Signed)
Spoke with patient and informed him per Dr.Adalbert Luking additional labs have been ordered. Patient verbalized understanding.

## 2016-08-21 NOTE — Telephone Encounter (Signed)
Pt came by stating that there is suppose to be two sets of labs in the system. The only labs that are currently in there are the ones for his physical. Pt states that Dr. [name] told him to make sure that the lab is aware there is two so that they only stick him once. Pt is currently fasting and was hoping to get these done today. Please advise.

## 2016-08-21 NOTE — Telephone Encounter (Signed)
Brendale-please do me a favor. Call [place] office regarding this patient asked that they put him on a cancellation list to see if potentially he can have his appointment moved [facility] unfortunately this gentleman has somewhat of a difficult schedule so it's hard to predict what day he could come in. If they could put him on a cancellation list then see if he can calm when a cancellation occurs I believe that is his best as we can do. Please let the patient know that this is been done. The patient can call Dr.Teoh office to give them specifics if he so desires

## 2016-08-21 NOTE — Telephone Encounter (Signed)
Active labs were all ordered on 07/24/16. Were there any other labs that you wanted patient to have drawn.

## 2016-08-22 NOTE — Telephone Encounter (Signed)
Called Dr. Deeann Saint office, had pt placed on cancellation list for [place].  Options to be seen in [place] are Monday or Thursday.  (Pt was offered this week but that didn't work for him.)  Called to let pt know he's been placed on a cancellation list.  Pt states he is completely fine with being seen on 09/06/16 because unless Dr. [name] feels it's an urgent (ASAP) need, he does not want to drive to [facility] to be seen  Offered for pt to call their office to see if he could get in sooner & was told, "if they want to call me to come in sooner, I can try to make it work"

## 2016-08-22 NOTE — Telephone Encounter (Signed)
Message forwarding to [facility]

## 2016-08-27 DIAGNOSIS — E785 Hyperlipidemia, unspecified: Secondary | ICD-10-CM | POA: Diagnosis not present

## 2016-08-27 DIAGNOSIS — R070 Pain in throat: Secondary | ICD-10-CM | POA: Diagnosis not present

## 2016-08-27 DIAGNOSIS — Z125 Encounter for screening for malignant neoplasm of prostate: Secondary | ICD-10-CM | POA: Diagnosis not present

## 2016-08-27 DIAGNOSIS — M542 Cervicalgia: Secondary | ICD-10-CM | POA: Diagnosis not present

## 2016-08-28 LAB — CBC WITH DIFFERENTIAL/PLATELET
Basophils Absolute: 0.1 10*3/uL (ref 0.0–0.2)
Basos: 1 %
EOS (ABSOLUTE): 0.2 10*3/uL (ref 0.0–0.4)
EOS: 2 %
HEMATOCRIT: 43.4 % (ref 37.5–51.0)
HEMOGLOBIN: 14.4 g/dL (ref 13.0–17.7)
Immature Grans (Abs): 0 10*3/uL (ref 0.0–0.1)
Immature Granulocytes: 0 %
LYMPHS ABS: 1.6 10*3/uL (ref 0.7–3.1)
Lymphs: 19 %
MCH: 29.8 pg (ref 26.6–33.0)
MCHC: 33.2 g/dL (ref 31.5–35.7)
MCV: 90 fL (ref 79–97)
MONOCYTES: 9 %
Monocytes Absolute: 0.8 10*3/uL (ref 0.1–0.9)
NEUTROS ABS: 6 10*3/uL (ref 1.4–7.0)
Neutrophils: 69 %
Platelets: 415 10*3/uL — ABNORMAL HIGH (ref 150–379)
RBC: 4.84 x10E6/uL (ref 4.14–5.80)
RDW: 13.8 % (ref 12.3–15.4)
WBC: 8.6 10*3/uL (ref 3.4–10.8)

## 2016-08-28 LAB — HEPATIC FUNCTION PANEL
ALBUMIN: 4 g/dL (ref 3.6–4.8)
ALT: 17 IU/L (ref 0–44)
AST: 20 IU/L (ref 0–40)
Alkaline Phosphatase: 87 IU/L (ref 39–117)
BILIRUBIN TOTAL: 0.4 mg/dL (ref 0.0–1.2)
BILIRUBIN, DIRECT: 0.1 mg/dL (ref 0.00–0.40)
TOTAL PROTEIN: 6.9 g/dL (ref 6.0–8.5)

## 2016-08-28 LAB — BASIC METABOLIC PANEL
BUN / CREAT RATIO: 14 (ref 10–24)
BUN: 17 mg/dL (ref 8–27)
CO2: 27 mmol/L (ref 18–29)
CREATININE: 1.2 mg/dL (ref 0.76–1.27)
Calcium: 9.9 mg/dL (ref 8.6–10.2)
Chloride: 103 mmol/L (ref 96–106)
GFR calc non Af Amer: 63 mL/min/{1.73_m2} (ref >59)
GFR, EST AFRICAN AMERICAN: 73 mL/min/{1.73_m2} (ref >59)
Glucose: 102 mg/dL — ABNORMAL HIGH (ref 65–99)
Potassium: 5.1 mmol/L (ref 3.5–5.2)
Sodium: 144 mmol/L (ref 134–144)

## 2016-08-28 LAB — LIPID PANEL
CHOL/HDL RATIO: 3.9 ratio (ref 0.0–5.0)
Cholesterol, Total: 208 mg/dL — ABNORMAL HIGH (ref 100–199)
HDL: 54 mg/dL (ref >39)
LDL Calculated: 135 mg/dL — ABNORMAL HIGH (ref 0–99)
Triglycerides: 95 mg/dL (ref 0–149)
VLDL Cholesterol Cal: 19 mg/dL (ref 5–40)

## 2016-08-28 LAB — TSH: TSH: 5.93 u[IU]/mL — ABNORMAL HIGH (ref 0.450–4.500)

## 2016-08-28 LAB — PSA: Prostate Specific Ag, Serum: 16.2 ng/mL — ABNORMAL HIGH (ref 0.0–4.0)

## 2016-08-28 LAB — T4, FREE: Free T4: 1.22 ng/dL (ref 0.82–1.77)

## 2016-08-30 ENCOUNTER — Encounter: Payer: Self-pay | Admitting: Gastroenterology

## 2016-08-30 ENCOUNTER — Ambulatory Visit (INDEPENDENT_AMBULATORY_CARE_PROVIDER_SITE_OTHER): Payer: Medicare HMO | Admitting: Gastroenterology

## 2016-08-30 ENCOUNTER — Other Ambulatory Visit: Payer: Self-pay

## 2016-08-30 ENCOUNTER — Encounter: Payer: Self-pay | Admitting: Family Medicine

## 2016-08-30 ENCOUNTER — Ambulatory Visit (INDEPENDENT_AMBULATORY_CARE_PROVIDER_SITE_OTHER): Payer: Medicare HMO | Admitting: Family Medicine

## 2016-08-30 VITALS — BP 140/84 | Ht 66.25 in | Wt 166.1 lb

## 2016-08-30 DIAGNOSIS — R1310 Dysphagia, unspecified: Secondary | ICD-10-CM

## 2016-08-30 DIAGNOSIS — F458 Other somatoform disorders: Secondary | ICD-10-CM | POA: Diagnosis not present

## 2016-08-30 DIAGNOSIS — K625 Hemorrhage of anus and rectum: Secondary | ICD-10-CM

## 2016-08-30 DIAGNOSIS — R198 Other specified symptoms and signs involving the digestive system and abdomen: Secondary | ICD-10-CM

## 2016-08-30 DIAGNOSIS — E782 Mixed hyperlipidemia: Secondary | ICD-10-CM

## 2016-08-30 DIAGNOSIS — R972 Elevated prostate specific antigen [PSA]: Secondary | ICD-10-CM

## 2016-08-30 DIAGNOSIS — R69 Illness, unspecified: Secondary | ICD-10-CM | POA: Diagnosis not present

## 2016-08-30 DIAGNOSIS — R0989 Other specified symptoms and signs involving the circulatory and respiratory systems: Secondary | ICD-10-CM

## 2016-08-30 MED ORDER — PEG 3350-KCL-NA BICARB-NACL 420 G PO SOLR: 4000.0000 mL | ORAL | 0 refills | Status: DC

## 2016-08-30 MED ORDER — ATORVASTATIN CALCIUM 40 MG PO TABS: 40.0000 mg | ORAL_TABLET | Freq: Every day | ORAL | 6 refills | Status: DC

## 2016-08-30 NOTE — Progress Notes (Signed)
Primary Care Physician:  Kathyrn Drown, MD Primary Gastroenterologist:  Dr. Gala Romney   Chief Complaint  Patient presents with  . Colonoscopy    last tcs 10 yrs ago  . Rectal Bleeding  . Hemorrhoids  . Gastroesophageal Reflux    feels like stricture at times in throat when swallows, started Omeprazole 3 wks ago    HPI:   Dylan Chapman is a 66 y.o. male presenting today to arrange colonoscopy, as his last was in 2007 by Dr. Gala Romney with normal rectum and scattered sigmoid diverticulum.   Has had intermittent episodes of low-volume hematochezia for the past year. No rectal pain or discomfort. No significant bowel habit changes. No abdominal pain.   Saw Dr. Wolfgang Phoenix a few weeks ago and felt he may be getting an "infection in my throat". Strep test negative. Will be seeing ENT (Dr. Benjamine Mola) next week on May 17. Sometimes with swallowing food, there is a "feeling". Has been on omeprazole OTC for several weeks but not noting any improvement. Has globus sensation.   Past Medical History:  Diagnosis Date  . Hyperlipidemia   . Migraines     Past Surgical History:  Procedure Laterality Date  . BACK SURGERY    . COLONOSCOPY  2007   Dr. Gala Romney: normal rectum and scattered sigmoid diverticulum   . HERNIA REPAIR Left    left inguinal  . SHOULDER SURGERY      Current Outpatient Prescriptions  Medication Sig Dispense Refill  . aspirin 81 MG tablet Take 81 mg by mouth daily.    Marland Kitchen omeprazole (PRILOSEC) 20 MG capsule Take 20 mg by mouth daily.    Marland Kitchen PRESCRIPTION MEDICATION Compound for headaches    . propranolol (INDERAL) 80 MG tablet Take 1 tablet (80 mg total) by mouth 3 (three) times daily. (Patient not taking: Reported on 08/30/2016) 30 tablet 2  . atorvastatin (LIPITOR) 40 MG tablet Take 1 tablet (40 mg total) by mouth daily. 90 tablet 1  . polyethylene glycol-electrolytes (TRILYTE) 420 g solution Take 4,000 mLs by mouth as directed. (Patient not taking: Reported on 08/30/2016) 4000 mL 0   No  current facility-administered medications for this visit.     Allergies as of 08/30/2016 - Review Complete 08/30/2016  Allergen Reaction Noted  . Penicillins  06/28/2012    Family History  Problem Relation Age of Onset  . Heart disease Father   . Prostate cancer Father   . Colon cancer Neg Hx   . Colon polyps Neg Hx     Social History   Social History  . Marital status: Married    Spouse name: N/A  . Number of children: N/A  . Years of education: N/A   Occupational History  . self-employed     [place] History Main Topics  . Smoking status: Former Research scientist (life sciences)  . Smokeless tobacco: Never Used  . Alcohol use 1.8 oz/week    3 Glasses of wine per week     Comment: 3 glasses wine/week; occ beer  . Drug use: No  . Sexual activity: Not on file   Other Topics Concern  . Not on file   Social History Narrative  . No narrative on file    Review of Systems: As mentioned in HPI   Physical Exam: BP (!) 149/89   Pulse 82   Temp 97.3 F (36.3 C) (Oral)   Ht 5' 6.5" (1.689 m)   Wt 166 lb 3.2 oz (75.4  kg)   BMI 26.42 kg/m  General:   Alert and oriented. Pleasant and cooperative. Well-nourished and well-developed.  Head:  Normocephalic and atraumatic. Eyes:  Without icterus, sclera clear and conjunctiva pink.  Ears:  Normal auditory acuity. Nose:  No deformity, discharge,  or lesions. Mouth:  No deformity or lesions, oral mucosa pink.  Lungs:  Clear to auscultation bilaterally. No wheezes, rales, or rhonchi. No distress.  Heart:  S1, S2 present without murmurs appreciated.  Abdomen:  +BS, soft, non-tender and non-distended. No HSM noted. No guarding or rebound. No masses appreciated.  Rectal:  Deferred  Msk:  Symmetrical without gross deformities. Normal posture. Extremities:  Without edema. Neurologic:  Alert and  oriented x4;  grossly normal neurologically. Psych:  Alert and cooperative. Normal mood and affect.

## 2016-08-30 NOTE — Progress Notes (Signed)
   Subjective:    Patient ID: Dylan Chapman, male    DOB: [birth year], 66 y.o.   MRN: [id number]  HPI  Patient in today to discuss recent PSA level results.  His PSA levels dramatically increase from last year to this year he denies any hematuria lower abdominal pain back pain fever chills or sweats denies weight loss States no other concerns this visit.   Review of Systems Patient relates increased urinary frequency denies hematuria drinks a lot of water denies abdominal pain sweats fevers chills denies hematuria    Objective:   Physical Exam Lungs clear heart regular abdomen soft prostate exam slight enlargement no hard nodules       Assessment & Plan:  Long discussion held regarding elevated PSA-next step check total PSA along with free PSA. Urology referral based upon that. More than likely will need biopsies.  Subclinical hypothyroidism Will recheck TSH again in 6 months  Hyperlipidemia patient states he is taking his medicine. Therefore switched medicine to generic Lipitor 40 mg daily recheck lipid liver profile in 8-12 weeks  Patient does relate toenail fungus we will look at his next week during a wellness exam.

## 2016-08-30 NOTE — Patient Instructions (Signed)
We have scheduled you for a colonoscopy and possible upper endoscopy with Dr. Gala Romney.  Further recommendations to follow!

## 2016-08-31 ENCOUNTER — Other Ambulatory Visit: Payer: Self-pay | Admitting: *Deleted

## 2016-08-31 LAB — PSA, TOTAL AND FREE
PSA, Free Pct: 11.8 %
PSA, Free: 1.99 ng/mL
Prostate Specific Ag, Serum: 16.9 ng/mL — ABNORMAL HIGH (ref 0.0–4.0)

## 2016-08-31 MED ORDER — ATORVASTATIN CALCIUM 40 MG PO TABS: 40.0000 mg | ORAL_TABLET | Freq: Every day | ORAL | 1 refills | Status: DC

## 2016-08-31 NOTE — Addendum Note (Signed)
Addended by: Sallee Lange A on: 08/31/2016 08:51 AM   Modules accepted: Orders

## 2016-09-02 ENCOUNTER — Encounter: Payer: Self-pay | Admitting: Gastroenterology

## 2016-09-02 DIAGNOSIS — K625 Hemorrhage of anus and rectum: Secondary | ICD-10-CM | POA: Insufficient documentation

## 2016-09-02 DIAGNOSIS — R0989 Other specified symptoms and signs involving the circulatory and respiratory systems: Secondary | ICD-10-CM | POA: Insufficient documentation

## 2016-09-02 DIAGNOSIS — R198 Other specified symptoms and signs involving the digestive system and abdomen: Secondary | ICD-10-CM | POA: Insufficient documentation

## 2016-09-02 NOTE — Assessment & Plan Note (Signed)
66 year old male with several episodes of low-volume hematochezia over past year without any other lower GI concerns. Last colonoscopy in 2007 with normal rectum and scattered sigmoid diverticulum. Likely benign anorectal source but deserves further evaluation, and he is also overdue for colonoscopy at this time.  Proceed with TCS with Dr. Gala Romney in near future: the risks, benefits, and alternatives have been discussed with the patient in detail. The patient states understanding and desires to proceed. Phenergan 12.5 mg IV on call

## 2016-09-02 NOTE — Assessment & Plan Note (Addendum)
Vague symptoms with globus sensation, sore throat. Prilosec without improvement. Scheduled with ENT on May 17th. Will obtain those notes once available and may need EGD at time of colonoscopy.   Possible EGD with dilation at time of colonoscopy. Risks and benefits reviewed in detail with stated understanding. Continue Prilosec once daily.   ADDENDUM: had a lot of difficulty obtaining records from Dr. Benjamine Mola due to release of information issues. However, I spoke with patient personally. He tells me that Dr. Benjamine Mola saw "reflux changes" and placed him on an additional medication in the evening (he is unsure the name). HOWEVER, he has noted improvement already. Discussed that EGD may not be necessary IF his symptoms are completely resolved by time of procedure. He is aware.

## 2016-09-03 ENCOUNTER — Other Ambulatory Visit: Payer: Self-pay

## 2016-09-03 ENCOUNTER — Telehealth: Payer: Self-pay

## 2016-09-03 ENCOUNTER — Ambulatory Visit (INDEPENDENT_AMBULATORY_CARE_PROVIDER_SITE_OTHER): Payer: Medicare HMO | Admitting: Family Medicine

## 2016-09-03 ENCOUNTER — Encounter: Payer: Self-pay | Admitting: Family Medicine

## 2016-09-03 VITALS — BP 122/74 | Ht 65.5 in | Wt 164.6 lb

## 2016-09-03 DIAGNOSIS — Z23 Encounter for immunization: Secondary | ICD-10-CM

## 2016-09-03 DIAGNOSIS — Z136 Encounter for screening for cardiovascular disorders: Secondary | ICD-10-CM

## 2016-09-03 DIAGNOSIS — Z0000 Encounter for general adult medical examination without abnormal findings: Secondary | ICD-10-CM | POA: Diagnosis not present

## 2016-09-03 DIAGNOSIS — R972 Elevated prostate specific antigen [PSA]: Secondary | ICD-10-CM | POA: Diagnosis not present

## 2016-09-03 DIAGNOSIS — Z114 Encounter for screening for human immunodeficiency virus [HIV]: Secondary | ICD-10-CM

## 2016-09-03 DIAGNOSIS — Z1159 Encounter for screening for other viral diseases: Secondary | ICD-10-CM | POA: Diagnosis not present

## 2016-09-03 DIAGNOSIS — R69 Illness, unspecified: Secondary | ICD-10-CM | POA: Diagnosis not present

## 2016-09-03 MED ORDER — NA SULFATE-K SULFATE-MG SULF 17.5-3.13-1.6 GM/177ML PO SOLN: 1.0000 | ORAL | 0 refills | Status: DC

## 2016-09-03 NOTE — Progress Notes (Signed)
   Subjective:    Patient ID: DRAEDEN KELLMAN, male    DOB: 1951/02/11, 66 y.o.   MRN: [id number]  HPI The patient comes in today for a wellness visit.    A review of their health history was completed.  A review of medications was also completed.  Any needed refills; no  Eating habits: eating healthy  Falls/  MVA accidents in past few months: none  Regular exercise: recently started a few months ago  Specialist pt sees on regular basis: no  Preventative health issues were discussed.   Additional concerns: just a few wellness related questions per patient     Review of Systems  Constitutional: Negative for fatigue and fever.  HENT: Negative for congestion.   Respiratory: Negative for cough, choking and shortness of breath.   Cardiovascular: Negative for chest pain.  Gastrointestinal: Negative for abdominal pain.  Genitourinary: Negative for difficulty urinating, dysuria, flank pain, frequency and hematuria.  Musculoskeletal: Negative for arthralgias.  Psychiatric/Behavioral: Negative for behavioral problems.       Objective:   Physical Exam  Constitutional: He appears well-developed and well-nourished.  HENT:  Head: Normocephalic and atraumatic.  Right Ear: External ear normal.  Left Ear: External ear normal.  Nose: Nose normal.  Mouth/Throat: Oropharynx is clear and moist.  Eyes: EOM are normal. Pupils are equal, round, and reactive to light.  Neck: Normal range of motion. Neck supple. No thyromegaly present.  Cardiovascular: Normal rate, regular rhythm and normal heart sounds.   No murmur heard. Pulmonary/Chest: Effort normal and breath sounds normal. No respiratory distress. He has no wheezes.  Abdominal: Soft. Bowel sounds are normal. He exhibits no distension and no mass. There is no tenderness.  Genitourinary: Penis normal.  Musculoskeletal: Normal range of motion. He exhibits no edema.  Lymphadenopathy:    He has no cervical adenopathy.  Neurological: He  is alert. He exhibits normal muscle tone.  Skin: Skin is warm and dry. No erythema.  Psychiatric: He has a normal mood and affect. His behavior is normal. Judgment normal.          Assessment & Plan:  Adult wellness-complete.wellness physical was conducted today. Importance of diet and exercise were discussed in detail. In addition to this a discussion regarding safety was also covered. We also reviewed over immunizations and gave recommendations regarding current immunization needed for age. In addition to this additional areas were also touched on including: Preventative health exams needed: Colonoscopy patient will do colonoscopy within the coming month  Patient was advised yearly wellness exam  Patient was a smoker years ago therefore ultrasound of aorta as part of his welcome to Medicare  EKG looks good part of his welcome to Medicare  Pneumonia vaccine Prevnar 13 today  Patient will be seen ENT in the coming weeks as part of previous discussion/office visit  Toenail fungus-patient is considering Lamisil per his request he will call back  Elevated PSA this was discussed last week we will be setting him up with urology  Follow-up medical issues 6 months

## 2016-09-03 NOTE — Progress Notes (Signed)
cc'ed to pcp °

## 2016-09-03 NOTE — Telephone Encounter (Signed)
Pt called office and requested rx for Suprep be sent to [facility]. He said he knows he will have to pay $95, but he rather have Suprep instead of Tri-Lyte. He is scheduled for tcs 10/09/16. New instructions mailed to pt and rx for Suprep sent to [facility].

## 2016-09-05 ENCOUNTER — Encounter: Payer: Self-pay | Admitting: Family Medicine

## 2016-09-06 ENCOUNTER — Ambulatory Visit (INDEPENDENT_AMBULATORY_CARE_PROVIDER_SITE_OTHER): Payer: Medicare HMO | Admitting: Otolaryngology

## 2016-09-06 DIAGNOSIS — R972 Elevated prostate specific antigen [PSA]: Secondary | ICD-10-CM | POA: Diagnosis not present

## 2016-09-06 DIAGNOSIS — K219 Gastro-esophageal reflux disease without esophagitis: Secondary | ICD-10-CM

## 2016-09-06 DIAGNOSIS — R070 Pain in throat: Secondary | ICD-10-CM

## 2016-09-07 ENCOUNTER — Encounter: Payer: Self-pay | Admitting: Family Medicine

## 2016-09-08 ENCOUNTER — Other Ambulatory Visit: Payer: Self-pay | Admitting: Family Medicine

## 2016-09-08 MED ORDER — OMEPRAZOLE 20 MG PO CPDR: 20.0000 mg | DELAYED_RELEASE_CAPSULE | Freq: Every day | ORAL | 3 refills | Status: DC

## 2016-09-10 ENCOUNTER — Ambulatory Visit (HOSPITAL_COMMUNITY)
Admission: RE | Admit: 2016-09-10 | Discharge: 2016-09-10 | Disposition: A | Discharge status: Home / Self Care | Payer: Medicare HMO | Source: Ambulatory Visit | Attending: Family Medicine | Admitting: Family Medicine

## 2016-09-10 ENCOUNTER — Telehealth: Payer: Self-pay | Admitting: Gastroenterology

## 2016-09-10 DIAGNOSIS — Z136 Encounter for screening for cardiovascular disorders: Secondary | ICD-10-CM | POA: Insufficient documentation

## 2016-09-10 DIAGNOSIS — I77811 Abdominal aortic ectasia: Secondary | ICD-10-CM | POA: Diagnosis not present

## 2016-09-10 DIAGNOSIS — Z8489 Family history of other specified conditions: Secondary | ICD-10-CM | POA: Diagnosis not present

## 2016-09-10 NOTE — Telephone Encounter (Signed)
Dylan Chapman: can we get notes from ENT? Patient was seen on 5/17. If they could send ASAP, that would be great. Trying to determine if needing EGD at time of colonoscopy.

## 2016-09-10 NOTE — Telephone Encounter (Signed)
Requested records from Dr Benjamine Mola ASAP

## 2016-09-13 NOTE — Telephone Encounter (Signed)
Dylan Chapman, any word on records?

## 2016-09-13 NOTE — Telephone Encounter (Signed)
I sent another request for records.

## 2016-09-19 ENCOUNTER — Telehealth: Payer: Self-pay | Admitting: Internal Medicine

## 2016-09-19 NOTE — Telephone Encounter (Signed)
I called the patient and he is going to try to stop by the office today or tomorrow to sign a release of records so I can get that to Dr Deeann Saint office ASAP.

## 2016-09-19 NOTE — Telephone Encounter (Signed)
Patient signed release and it was faxed to Dr Benjamine Mola ASAP

## 2016-09-19 NOTE — Telephone Encounter (Signed)
I called them personally and they stated they did not have a release from patient to request records. Don't we have this on file? Can we send again? Thanks so much!  Vicente Males

## 2016-09-19 NOTE — Telephone Encounter (Signed)
I will need to send the patient a release of records and have him get it back to me. Dr Deeann Saint office will not release records without it. (I have gotten them in the past without one). I told her the urgency of getting the records.

## 2016-09-20 ENCOUNTER — Encounter: Payer: Self-pay | Admitting: Family Medicine

## 2016-09-25 ENCOUNTER — Encounter: Payer: Self-pay | Admitting: Urology

## 2016-09-25 DIAGNOSIS — R972 Elevated prostate specific antigen [PSA]: Secondary | ICD-10-CM | POA: Diagnosis not present

## 2016-10-09 ENCOUNTER — Ambulatory Visit (HOSPITAL_COMMUNITY)
Admission: RE | Admit: 2016-10-09 | Discharge: 2016-10-09 | Disposition: A | Discharge status: Home / Self Care | Payer: Medicare HMO | Source: Ambulatory Visit | Attending: Internal Medicine | Admitting: Internal Medicine

## 2016-10-09 ENCOUNTER — Encounter (HOSPITAL_COMMUNITY)
Admission: RE | Disposition: A | Discharge status: Home / Self Care | Payer: Self-pay | Source: Ambulatory Visit | Attending: Internal Medicine

## 2016-10-09 ENCOUNTER — Encounter (HOSPITAL_COMMUNITY): Payer: Self-pay

## 2016-10-09 DIAGNOSIS — Z880 Allergy status to penicillin: Secondary | ICD-10-CM | POA: Diagnosis not present

## 2016-10-09 DIAGNOSIS — Z8042 Family history of malignant neoplasm of prostate: Secondary | ICD-10-CM | POA: Insufficient documentation

## 2016-10-09 DIAGNOSIS — Z8249 Family history of ischemic heart disease and other diseases of the circulatory system: Secondary | ICD-10-CM | POA: Insufficient documentation

## 2016-10-09 DIAGNOSIS — K921 Melena: Secondary | ICD-10-CM

## 2016-10-09 DIAGNOSIS — Z87891 Personal history of nicotine dependence: Secondary | ICD-10-CM | POA: Insufficient documentation

## 2016-10-09 DIAGNOSIS — K640 First degree hemorrhoids: Secondary | ICD-10-CM

## 2016-10-09 DIAGNOSIS — E785 Hyperlipidemia, unspecified: Secondary | ICD-10-CM | POA: Diagnosis not present

## 2016-10-09 DIAGNOSIS — D127 Benign neoplasm of rectosigmoid junction: Secondary | ICD-10-CM

## 2016-10-09 DIAGNOSIS — K209 Esophagitis, unspecified: Secondary | ICD-10-CM | POA: Diagnosis not present

## 2016-10-09 DIAGNOSIS — K5730 Diverticulosis of large intestine without perforation or abscess without bleeding: Secondary | ICD-10-CM | POA: Insufficient documentation

## 2016-10-09 DIAGNOSIS — F458 Other somatoform disorders: Secondary | ICD-10-CM | POA: Diagnosis not present

## 2016-10-09 DIAGNOSIS — R69 Illness, unspecified: Secondary | ICD-10-CM | POA: Diagnosis not present

## 2016-10-09 DIAGNOSIS — K625 Hemorrhage of anus and rectum: Secondary | ICD-10-CM

## 2016-10-09 DIAGNOSIS — R1310 Dysphagia, unspecified: Secondary | ICD-10-CM

## 2016-10-09 DIAGNOSIS — Z79899 Other long term (current) drug therapy: Secondary | ICD-10-CM | POA: Diagnosis not present

## 2016-10-09 DIAGNOSIS — Z7982 Long term (current) use of aspirin: Secondary | ICD-10-CM | POA: Insufficient documentation

## 2016-10-09 HISTORY — PX: COLONOSCOPY: SHX5424

## 2016-10-09 HISTORY — PX: MALONEY DILATION: SHX5535

## 2016-10-09 HISTORY — PX: ESOPHAGOGASTRODUODENOSCOPY: SHX5428

## 2016-10-09 SURGERY — COLONOSCOPY
Anesthesia: Moderate Sedation

## 2016-10-09 MED ORDER — PROMETHAZINE HCL 25 MG/ML IJ SOLN
INTRAMUSCULAR | Status: AC
  Filled 2016-10-09: qty 1

## 2016-10-09 MED ORDER — PROMETHAZINE HCL 25 MG/ML IJ SOLN
12.5000 mg | Freq: Once | INTRAMUSCULAR | Status: AC
  Administered 2016-10-09: 12.5 mg via INTRAVENOUS

## 2016-10-09 MED ORDER — ONDANSETRON HCL 4 MG/2ML IJ SOLN
INTRAMUSCULAR | Status: DC | PRN
  Administered 2016-10-09: 4 mg via INTRAVENOUS

## 2016-10-09 MED ORDER — ONDANSETRON HCL 4 MG/2ML IJ SOLN
INTRAMUSCULAR | Status: AC
  Filled 2016-10-09: qty 2

## 2016-10-09 MED ORDER — MIDAZOLAM HCL 5 MG/5ML IJ SOLN
INTRAMUSCULAR | Status: DC | PRN
  Administered 2016-10-09: 2 mg via INTRAVENOUS
  Administered 2016-10-09: 1 mg via INTRAVENOUS
  Administered 2016-10-09: 2 mg via INTRAVENOUS
  Administered 2016-10-09: 1 mg via INTRAVENOUS

## 2016-10-09 MED ORDER — LIDOCAINE VISCOUS 2 % MT SOLN
OROMUCOSAL | Status: DC | PRN
  Administered 2016-10-09: 4 mL via OROMUCOSAL

## 2016-10-09 MED ORDER — SODIUM CHLORIDE 0.9% FLUSH
INTRAVENOUS | Status: AC
  Filled 2016-10-09: qty 10

## 2016-10-09 MED ORDER — MEPERIDINE HCL 100 MG/ML IJ SOLN
INTRAMUSCULAR | Status: DC | PRN
  Administered 2016-10-09: 50 mg via INTRAVENOUS
  Administered 2016-10-09: 25 mg via INTRAVENOUS

## 2016-10-09 MED ORDER — SODIUM CHLORIDE 0.9 % IV SOLN
INTRAVENOUS | Status: DC
  Administered 2016-10-09: 14:00:00 via INTRAVENOUS

## 2016-10-09 MED ORDER — MIDAZOLAM HCL 5 MG/5ML IJ SOLN
INTRAMUSCULAR | Status: AC
  Filled 2016-10-09: qty 10

## 2016-10-09 MED ORDER — MEPERIDINE HCL 100 MG/ML IJ SOLN
INTRAMUSCULAR | Status: AC
  Filled 2016-10-09: qty 2

## 2016-10-09 MED ORDER — STERILE WATER FOR IRRIGATION IR SOLN
Status: DC | PRN
  Administered 2016-10-09: 15:00:00

## 2016-10-09 MED ORDER — LIDOCAINE VISCOUS 2 % MT SOLN
OROMUCOSAL | Status: AC
  Filled 2016-10-09: qty 15

## 2016-10-09 NOTE — Discharge Instructions (Addendum)
Diverticulosis Diverticulosis is a condition that develops when small pouches (diverticula) form in the wall of the large intestine (colon). The colon is where water is absorbed and stool is formed. The pouches form when the inside layer of the colon pushes through weak spots in the outer layers of the colon. You may have a few pouches or many of them. What are the causes? The cause of this condition is not known. What increases the risk? The following factors may make you more likely to develop this condition:  Being older than age 27. Your risk for this condition increases with age. Diverticulosis is rare among people younger than age 36. By age 28, many people have it.  Eating a low-fiber diet.  Having frequent constipation.  Being overweight.  Not getting enough exercise.  Smoking.  Taking over-the-counter pain medicines, like aspirin and ibuprofen.  Having a family history of diverticulosis.  What are the signs or symptoms? In most people, there are no symptoms of this condition. If you do have symptoms, they may include:  Bloating.  Cramps in the abdomen.  Constipation or diarrhea.  Pain in the lower left side of the abdomen.  How is this diagnosed? This condition is most often diagnosed during an exam for other colon problems. Because diverticulosis usually has no symptoms, it often cannot be diagnosed independently. This condition may be diagnosed by:  Using a flexible scope to examine the colon (colonoscopy).  Taking an X-ray of the colon after dye has been put into the colon (barium enema).  Doing a CT scan.  How is this treated? You may not need treatment for this condition if you have never developed an infection related to diverticulosis. If you have had an infection before, treatment may include:  Eating a high-fiber diet. This may include eating more fruits, vegetables, and grains.  Taking a fiber supplement.  Taking a live bacteria supplement  (probiotic).  Taking medicine to relax your colon.  Taking antibiotic medicines.  Follow these instructions at home:  Drink 6-8 glasses of water or more each day to prevent constipation.  Try not to strain when you have a bowel movement.  If you have had an infection before: ? Eat more fiber as directed by your health care provider or your diet and nutrition specialist (dietitian). ? Take a fiber supplement or probiotic, if your health care provider approves.  Take over-the-counter and prescription medicines only as told by your health care provider.  If you were prescribed an antibiotic, take it as told by your health care provider. Do not stop taking the antibiotic even if you start to feel better.  Keep all follow-up visits as told by your health care provider. This is important. Contact a health care provider if:  You have pain in your abdomen.  You have bloating.  You have cramps.  You have not had a bowel movement in 3 days. Get help right away if:  Your pain gets worse.  Your bloating becomes very bad.  You have a fever or chills, and your symptoms suddenly get worse.  You vomit.  You have bowel movements that are bloody or black.  You have bleeding from your rectum. Summary  Diverticulosis is a condition that develops when small pouches (diverticula) form in the wall of the large intestine (colon).  You may have a few pouches or many of them.  This condition is most often diagnosed during an exam for other colon problems.  If you have had an  infection related to diverticulosis, treatment may include increasing the fiber in your diet, taking supplements, or taking medicines. This information is not intended to replace advice given to you by your health care provider. Make sure you discuss any questions you have with your health care provider. Document Released: 01/05/2004 Document Revised: 02/27/2016 Document Reviewed: 02/27/2016 Elsevier Interactive  Patient Education  2017 [employer]. Hemorrhoids Hemorrhoids are swollen veins in and around the rectum or anus. There are two types of hemorrhoids:  Internal hemorrhoids. These occur in the veins that are just inside the rectum. They may poke through to the outside and become irritated and painful.  External hemorrhoids. These occur in the veins that are outside of the anus and can be felt as a painful swelling or hard lump near the anus.  Most hemorrhoids do not cause serious problems, and they can be managed with home treatments such as diet and lifestyle changes. If home treatments do not help your symptoms, procedures can be done to shrink or remove the hemorrhoids. What are the causes? This condition is caused by increased pressure in the anal area. This pressure may result from various things, including:  Constipation.  Straining to have a bowel movement.  Diarrhea.  Pregnancy.  Obesity.  Sitting for long periods of time.  Heavy lifting or other activity that causes you to strain.  Anal sex.  What are the signs or symptoms? Symptoms of this condition include:  Pain.  Anal itching or irritation.  Rectal bleeding.  Leakage of stool (feces).  Anal swelling.  One or more lumps around the anus.  How is this diagnosed? This condition can often be diagnosed through a visual exam. Other exams or tests may also be done, such as:  Examination of the rectal area with a gloved hand (digital rectal exam).  Examination of the anal canal using a small tube (anoscope).  A blood test, if you have lost a significant amount of blood.  A test to look inside the colon (sigmoidoscopy or colonoscopy).  How is this treated? This condition can usually be treated at home. However, various procedures may be done if dietary changes, lifestyle changes, and other home treatments do not help your symptoms. These procedures can help make the hemorrhoids smaller or remove them  completely. Some of these procedures involve surgery, and others do not. Common procedures include:  Rubber band ligation. Rubber bands are placed at the base of the hemorrhoids to cut off the blood supply to them.  Sclerotherapy. Medicine is injected into the hemorrhoids to shrink them.  Infrared coagulation. A type of light energy is used to get rid of the hemorrhoids.  Hemorrhoidectomy surgery. The hemorrhoids are surgically removed, and the veins that supply them are tied off.  Stapled hemorrhoidopexy surgery. A circular stapling device is used to remove the hemorrhoids and use staples to cut off the blood supply to them.  Follow these instructions at home: Eating and drinking  Eat foods that have a lot of fiber in them, such as whole grains, beans, nuts, fruits, and vegetables. Ask your health care provider about taking products that have added fiber (fiber supplements).  Drink enough fluid to keep your urine clear or pale yellow. Managing pain and swelling  Take warm sitz baths for 20 minutes, 3-4 times a day to ease pain and discomfort.  If directed, apply ice to the affected area. Using ice packs between sitz baths may be helpful. ? Put ice in a plastic bag. ? Place  a towel between your skin and the bag. ? Leave the ice on for 20 minutes, 2-3 times a day. General instructions  Take over-the-counter and prescription medicines only as told by your health care provider.  Use medicated creams or suppositories as told.  Exercise regularly.  Go to the bathroom when you have the urge to have a bowel movement. Do not wait.  Avoid straining to have bowel movements.  Keep the anal area dry and clean. Use wet toilet paper or moist towelettes after a bowel movement.  Do not sit on the toilet for long periods of time. This increases blood pooling and pain. Contact a health care provider if:  You have increasing pain and swelling that are not controlled by treatment or  medicine.  You have uncontrolled bleeding.  You have difficulty having a bowel movement, or you are unable to have a bowel movement.  You have pain or inflammation outside the area of the hemorrhoids. This information is not intended to replace advice given to you by your health care provider. Make sure you discuss any questions you have with your health care provider. Document Released: 04/06/2000 Document Revised: 09/07/2015 Document Reviewed: 12/22/2014 Elsevier Interactive Patient Education  2017 [place]. Colon Polyps Polyps are tissue growths inside the body. Polyps can grow in many places, including the large intestine (colon). A polyp may be a round bump or a mushroom-shaped growth. You could have one polyp or several. Most colon polyps are noncancerous (benign). However, some colon polyps can become cancerous over time. What are the causes? The exact cause of colon polyps is not known. What increases the risk? This condition is more likely to develop in people who:  Have a family history of colon cancer or colon polyps.  Are older than 55 or older than 45 if they are African American.  Have inflammatory bowel disease, such as ulcerative colitis or Crohn disease.  Are overweight.  Smoke cigarettes.  Do not get enough exercise.  Drink too much alcohol.  Eat a diet that is: ? High in fat and red meat. ? Low in fiber.  Had childhood cancer that was treated with abdominal radiation.  What are the signs or symptoms? Most polyps do not cause symptoms. If you have symptoms, they may include:  Blood coming from your rectum when having a bowel movement.  Blood in your stool.The stool may look dark red or black.  A change in bowel habits, such as constipation or diarrhea.  How is this diagnosed? This condition is diagnosed with a colonoscopy. This is a procedure that uses a lighted, flexible scope to look at the inside of your colon. How is this  treated? Treatment for this condition involves removing any polyps that are found. Those polyps will then be tested for cancer. If cancer is found, your health care provider will talk to you about options for colon cancer treatment. Follow these instructions at home: Diet  Eat plenty of fiber, such as fruits, vegetables, and whole grains.  Eat foods that are high in calcium and vitamin D, such as milk, cheese, yogurt, eggs, liver, fish, and broccoli.  Limit foods high in fat, red meats, and processed meats, such as hot dogs, sausage, bacon, and lunch meats.  Maintain a healthy weight, or lose weight if recommended by your health care provider. General instructions  Do not smoke cigarettes.  Do not drink alcohol excessively.  Keep all follow-up visits as told by your health care provider. This is important.  This includes keeping regularly scheduled colonoscopies. Talk to your health care provider about when you need a colonoscopy.  Exercise every day or as told by your health care provider. Contact a health care provider if:  You have new or worsening bleeding during a bowel movement.  You have new or increased blood in your stool.  You have a change in bowel habits.  You unexpectedly lose weight. This information is not intended to replace advice given to you by your health care provider. Make sure you discuss any questions you have with your health care provider. Document Released: 01/04/2004 Document Revised: 09/15/2015 Document Reviewed: 02/28/2015 Elsevier Interactive Patient Education  2018 [place]. Gastroesophageal Reflux Disease, Adult Normally, food travels down the esophagus and stays in the stomach to be digested. If a person has gastroesophageal reflux disease (GERD), food and stomach acid move back up into the esophagus. When this happens, the esophagus becomes sore and swollen (inflamed). Over time, GERD can make small holes (ulcers) in the lining of the  esophagus. Follow these instructions at home: Diet  Follow a diet as told by your doctor. You may need to avoid foods and drinks such as: ? Coffee and tea (with or without caffeine). ? Drinks that contain alcohol. ? Energy drinks and sports drinks. ? Carbonated drinks or sodas. ? Chocolate and cocoa. ? Peppermint and mint flavorings. ? Garlic and onions. ? Horseradish. ? Spicy and acidic foods, such as peppers, chili powder, curry powder, vinegar, hot sauces, and BBQ sauce. ? Citrus fruit juices and citrus fruits, such as oranges, lemons, and limes. ? Tomato-based foods, such as red sauce, chili, salsa, and pizza with red sauce. ? Fried and fatty foods, such as donuts, french fries, potato chips, and high-fat dressings. ? High-fat meats, such as hot dogs, rib eye steak, sausage, ham, and bacon. ? High-fat dairy items, such as whole milk, butter, and cream cheese.  Eat small meals often. Avoid eating large meals.  Avoid drinking large amounts of liquid with your meals.  Avoid eating meals during the 2-3 hours before bedtime.  Avoid lying down right after you eat.  Do not exercise right after you eat. General instructions  Pay attention to any changes in your symptoms.  Take over-the-counter and prescription medicines only as told by your doctor. Do not take aspirin, ibuprofen, or other NSAIDs unless your doctor says it is okay.  Do not use any tobacco products, including cigarettes, chewing tobacco, and e-cigarettes. If you need help quitting, ask your doctor.  Wear loose clothes. Do not wear anything tight around your waist.  Raise (elevate) the head of your bed about 6 inches (15 cm).  Try to lower your stress. If you need help doing this, ask your doctor.  If you are overweight, lose an amount of weight that is healthy for you. Ask your doctor about a safe weight loss goal.  Keep all follow-up visits as told by your doctor. This is important. Contact a doctor  if:  You have new symptoms.  You lose weight and you do not know why it is happening.  You have trouble swallowing, or it hurts to swallow.  You have wheezing or a cough that keeps happening.  Your symptoms do not get better with treatment.  You have a hoarse voice. Get help right away if:  You have pain in your arms, neck, jaw, teeth, or back.  You feel sweaty, dizzy, or light-headed.  You have chest pain or shortness of breath.  You throw up (vomit) and your throw up looks like blood or coffee grounds.  You pass out (faint).  Your poop (stool) is bloody or black.  You cannot swallow, drink, or eat. This information is not intended to replace advice given to you by your health care provider. Make sure you discuss any questions you have with your health care provider. Document Released: 09/26/2007 Document Revised: 09/15/2015 Document Reviewed: 08/04/2014 Elsevier Interactive Patient Education  2018 [employer].   Colonoscopy Discharge Instructions  Read the instructions outlined below and refer to this sheet in the next few weeks. These discharge instructions provide you with general information on caring for yourself after you leave the hospital. Your doctor may also give you specific instructions. While your treatment has been planned according to the most current medical practices available, unavoidable complications occasionally occur. If you have any problems or questions after discharge, call Dr. Gala Romney at [phone]. ACTIVITY  You may resume your regular activity, but move at a slower pace for the next 24 hours.   Take frequent rest periods for the next 24 hours.   Walking will help get rid of the air and reduce the bloated feeling in your belly (abdomen).   No driving for 24 hours (because of the medicine (anesthesia) used during the test).    Do not sign any important legal documents or operate any machinery for 24 hours (because of the anesthesia used during  the test).  NUTRITION  Drink plenty of fluids.   You may resume your normal diet as instructed by your doctor.   Begin with a light meal and progress to your normal diet. Heavy or fried foods are harder to digest and may make you feel sick to your stomach (nauseated).   Avoid alcoholic beverages for 24 hours or as instructed.  MEDICATIONS  You may resume your normal medications unless your doctor tells you otherwise.  WHAT YOU CAN EXPECT TODAY  Some feelings of bloating in the abdomen.   Passage of more gas than usual.   Spotting of blood in your stool or on the toilet paper.  IF YOU HAD POLYPS REMOVED DURING THE COLONOSCOPY:  No aspirin products for 7 days or as instructed.   No alcohol for 7 days or as instructed.   Eat a soft diet for the next 24 hours.  FINDING OUT THE RESULTS OF YOUR TEST Not all test results are available during your visit. If your test results are not back during the visit, make an appointment with your caregiver to find out the results. Do not assume everything is normal if you have not heard from your caregiver or the medical facility. It is important for you to follow up on all of your test results.  SEEK IMMEDIATE MEDICAL ATTENTION IF:  You have more than a spotting of blood in your stool.   Your belly is swollen (abdominal distention).   You are nauseated or vomiting.   You have a temperature over 101.   You have abdominal pain or discomfort that is severe or gets worse throughout the day.  EGD Discharge instructions Please read the instructions outlined below and refer to this sheet in the next few weeks. These discharge instructions provide you with general information on caring for yourself after you leave the hospital. Your doctor may also give you specific instructions. While your treatment has been planned according to the most current medical practices available, unavoidable complications occasionally occur. If you have any problems or  questions  after discharge, please call your doctor. ACTIVITY  You may resume your regular activity but move at a slower pace for the next 24 hours.   Take frequent rest periods for the next 24 hours.   Walking will help expel (get rid of) the air and reduce the bloated feeling in your abdomen.   No driving for 24 hours (because of the anesthesia (medicine) used during the test).   You may shower.   Do not sign any important legal documents or operate any machinery for 24 hours (because of the anesthesia used during the test).  NUTRITION  Drink plenty of fluids.   You may resume your normal diet.   Begin with a light meal and progress to your normal diet.   Avoid alcoholic beverages for 24 hours or as instructed by your caregiver.  MEDICATIONS  You may resume your normal medications unless your caregiver tells you otherwise.  WHAT YOU CAN EXPECT TODAY  You may experience abdominal discomfort such as a feeling of fullness or gas pains.  FOLLOW-UP  Your doctor will discuss the results of your test with you.  SEEK IMMEDIATE MEDICAL ATTENTION IF ANY OF THE FOLLOWING OCCUR:  Excessive nausea (feeling sick to your stomach) and/or vomiting.   Severe abdominal pain and distention (swelling).   Trouble swallowing.   Temperature over 101 F (37.8 C).   Rectal bleeding or vomiting of blood.    GERD, colon polyp, hemorrhoid and diverticulosis information provided  Use Anusol cream to the anorectum 4 times a day as needed  Begin Benefiber 1 tablespoon twice daily  Increase omeprazole to 20 mg twice daily. May use ranitidine on top of  twice a day omeprazole as needed if he has any flare in symptoms  Pamphlet on hemorrhoid banding provided  Office visit with [place] in 3 months  Further recommendations to follow pending review of pathology report

## 2016-10-09 NOTE — Op Note (Signed)
[facility] Patient Name: Dylan Chapman Procedure Date: 10/09/2016 2:37 PM MRN: [id number] Date of Birth: 1951-04-01 Attending MD: Norvel Richards , MD CSN: [id number] Age: 66 Admit Type: Outpatient Procedure:                Colonoscopy Indications:              Hematochezia Providers:                Norvel Richards, MD, Jeanann Lewandowsky. Gwenlyn Perking RN, RN,                            Randa Spike, Technician Referring MD:              Medicines:                Midazolam 5 mg IV, Meperidine 75 mg IV, Ondansetron                            4 mg IV, Promethazine 71.6 mg IV Complications:            No immediate complications. Estimated Blood Loss:     Estimated blood loss was minimal. Procedure:                Pre-Anesthesia Assessment:                           - Prior to the procedure, a History and Physical                            was performed, and patient medications and                            allergies were reviewed. The patient's tolerance of                            previous anesthesia was also reviewed. The risks                            and benefits of the procedure and the sedation                            options and risks were discussed with the patient.                            All questions were answered, and informed consent                            was obtained. Prior Anticoagulants: The patient has                            taken no previous anticoagulant or antiplatelet                            agents. ASA Grade Assessment: II - A patient with  mild systemic disease. After reviewing the risks                            and benefits, the patient was deemed in                            satisfactory condition to undergo the procedure.                           After obtaining informed consent, the colonoscope                            was passed under direct vision. Throughout the                            procedure, the  patient's blood pressure, pulse, and                            oxygen saturations were monitored continuously. The                            [id number] ([id number]) scope was introduced through                            the anus and advanced to the the cecum, identified                            by appendiceal orifice and ileocecal valve. The                            colonoscopy was performed without difficulty. The                            patient tolerated the procedure well. The quality                            of the bowel preparation was adequate. The entire                            colon was well visualized. The ileocecal valve,                            appendiceal orifice, and rectum were photographed. Scope In: 2:58:11 PM Scope Out: 3:10:17 PM Scope Withdrawal Time: 0 hours 8 minutes 36 seconds  Total Procedure Duration: 0 hours 12 minutes 6 seconds  Findings:      The perianal and digital rectal examinations were normal.      Non-bleeding internal hemorrhoids were found during retroflexion. The       hemorrhoids were Grade I (internal hemorrhoids that do not prolapse).      Scattered small and large-mouthed diverticula were found in the sigmoid       colon and descending colon. There was no evidence of diverticular       bleeding.      A 4 mm polyp was found  in the recto-sigmoid colon. The polyp was sessile.      The exam was otherwise without abnormality on direct and retroflexion       views. The polyp was removed with a cold snare. Resection and retrieval       were complete. Estimated blood loss was minimal. Impression:               - Non-bleeding internal hemorrhoids.                           - Diverticulosis in the sigmoid colon and in the                            descending colon. There was no evidence of                            diverticular bleeding.                           - One 4 mm polyp at the recto-sigmoid colon,                             removed with a cold snare. Resected and retrieved.                           - The examination was otherwise normal on direct                            and retroflexion views. Moderate Sedation:      Moderate (conscious) sedation was administered by the endoscopy nurse       and supervised by the endoscopist. The following parameters were       monitored: oxygen saturation, heart rate, blood pressure, respiratory       rate, EKG, adequacy of pulmonary ventilation, and response to care.       Total physician intraservice time was 23 minutes. Recommendation:           - Patient has a contact number available for                            emergencies. The signs and symptoms of potential                            delayed complications were discussed with the                            patient. Return to normal activities tomorrow.                            Written discharge instructions were provided to the                            patient.                           - Resume previous diet.                           -  Continue present medications. Course of Anusol                            cream to the anorectum 4 times a day. Begin                            Benefiber 1 tablespoon twice daily. Pamphlet on                            hemorrhoid banding provided to the patient.                           - Repeat colonoscopy date to be determined after                            pending pathology results are reviewed for                            surveillance based on pathology results.                           - Return to GI office in 3 months. See EGD report. Procedure Code(s):        --- Professional ---                           [phone], Colonoscopy, flexible; with removal of                            tumor(s), polyp(s), or other lesion(s) by snare                            technique                           99152, Moderate sedation services provided by the                             same physician or other qualified health care                            professional performing the diagnostic or                            therapeutic service that the sedation supports,                            requiring the presence of an independent trained                            observer to assist in the monitoring of the                            patient's level of consciousness and physiological  status; initial 15 minutes of intraservice time,                            patient age 30 years or older                           [phone], Moderate sedation services; each additional                            15 minutes intraservice time Diagnosis Code(s):        --- Professional ---                           K64.0, First degree hemorrhoids                           D12.7, Benign neoplasm of rectosigmoid junction                           K92.1, Melena (includes Hematochezia)                           K57.30, Diverticulosis of large intestine without                            perforation or abscess without bleeding CPT copyright 2016 American Medical Association. All rights reserved. The codes documented in this report are preliminary and upon coder review may  be revised to meet current compliance requirements. Cristopher Estimable. Cereniti Curb, MD Norvel Richards, MD 10/09/2016 3:15:43 PM This report has been signed electronically. Number of Addenda: 0

## 2016-10-09 NOTE — Op Note (Signed)
[facility] Patient Name: Dylan Chapman Procedure Date: 10/09/2016 3:12 PM MRN: [id number] Date of Birth: [birth year] Attending MD: Norvel Richards , MD CSN: [id number] Age: 66 Admit Type: Outpatient Procedure:                Upper GI endoscopy Indications:              Globus sensation Providers:                Norvel Richards, MD, Gwenlyn Fudge RN, RN,                            Randa Spike, Technician Referring MD:              Medicines:                Midazolam 6 mg IV, Meperidine 75 mg IV, Ondansetron                            4 mg IV, Promethazine 66.2 mg IV Complications:            No immediate complications. Estimated Blood Loss:     Estimated blood loss: none. Procedure:                Pre-Anesthesia Assessment:                           - Prior to the procedure, a History and Physical                            was performed, and patient medications and                            allergies were reviewed. The patient's tolerance of                            previous anesthesia was also reviewed. The risks                            and benefits of the procedure and the sedation                            options and risks were discussed with the patient.                            All questions were answered, and informed consent                            was obtained. Prior Anticoagulants: The patient has                            taken no previous anticoagulant or antiplatelet                            agents. ASA Grade Assessment: II - A patient with  mild systemic disease. After reviewing the risks                            and benefits, the patient was deemed in                            satisfactory condition to undergo the procedure.                           After obtaining informed consent, the endoscope was                            passed under direct vision. Throughout the                            procedure, the  patient's blood pressure, pulse, and                            oxygen saturations were monitored continuously. The                            [id number] ([id number]) scope was introduced through the                            mouth, and advanced to the second part of duodenum.                            The upper GI endoscopy was accomplished without                            difficulty. The patient tolerated the procedure                            well. Scope In: 3:16:28 PM Scope Out: 3:19:40 PM Total Procedure Duration: 0 hours 3 minutes 12 seconds  Findings:      LA Grade A (one or more mucosal breaks less than 5 mm, not extending       between tops of 2 mucosal folds) esophagitis was found. No Barrett's       epithelium seen. Tubular esophagus patent throughout its course.      The entire examined stomach was normal.      The ampulla and second portion of the duodenum were normal. Impression:               - LA Grade A esophagitis.                           - Normal stomach.                           - Normal ampulla and second portion of the duodenum.                           - No specimens collected. Moderate Sedation:      Moderate (conscious) sedation was administered by the endoscopy nurse  and supervised by the endoscopist. The following parameters were       monitored: oxygen saturation, heart rate, blood pressure, respiratory       rate, EKG, adequacy of pulmonary ventilation, and response to care.       Total physician intraservice time was 29 minutes. Recommendation:           - Patient has a contact number available for                            emergencies. The signs and symptoms of potential                            delayed complications were discussed with the                            patient. Return to normal activities tomorrow.                            Written discharge instructions were provided to the                            patient.                            - Resume previous diet. Increase omeprazole to 20                            mg twice daily. Use ranitidne when necessaary. May                            take as long as 3 months for globus sensation to                            improve.                           - Continue present medications.                           - No repeat upper endoscopy.                           - Return to GI office in 3 months. See colonoscopy                            repot. Procedure Code(s):        --- Professional ---                           [phone], Esophagogastroduodenoscopy, flexible,                            transoral; diagnostic, including collection of                            specimen(s) by brushing or washing, when performed                            (  separate procedure)                           99152, Moderate sedation services provided by the                            same physician or other qualified health care                            professional performing the diagnostic or                            therapeutic service that the sedation supports,                            requiring the presence of an independent trained                            observer to assist in the monitoring of the                            patient's level of consciousness and physiological                            status; initial 15 minutes of intraservice time,                            patient age 57 years or older                           [phone], Moderate sedation services; each additional                            15 minutes intraservice time Diagnosis Code(s):        --- Professional ---                           K20.9, Esophagitis, unspecified                           F45.8, Other somatoform disorders CPT copyright 2016 American Medical Association. All rights reserved. The codes documented in this report are preliminary and upon coder review may  be revised to meet current  compliance requirements. Cristopher Estimable. Yatziri Wainwright, MD Norvel Richards, MD 10/09/2016 3:26:35 PM This report has been signed electronically. Number of Addenda: 0

## 2016-10-09 NOTE — H&P (Signed)
'@LOGO' @   Primary Care Physician:  Kathyrn Drown, MD Primary Gastroenterologist:  Dr. Gala Romney  Pre-Procedure History & Physical: HPI:  Dylan Chapman is a 66 y.o. male here for diagnostic colonoscopy-hematochezia. Globus sensation sees Dr. Benjamine Mola. Felt to have LPR. No dysphagia. No typical reflux symptoms on omeprazole 20 mg daily and ranitidine at bedtime.  Past Medical History:  Diagnosis Date  . Hyperlipidemia   . Migraines     Past Surgical History:  Procedure Laterality Date  . BACK SURGERY    . COLONOSCOPY  2007   Dr. Gala Romney: normal rectum and scattered sigmoid diverticulum   . HERNIA REPAIR Left    left inguinal  . SHOULDER SURGERY      Prior to Admission medications   Medication Sig Start Date End Date Taking? Authorizing Provider  aspirin 81 MG tablet Take 81 mg by mouth daily.   Yes [provider]  atorvastatin (LIPITOR) 40 MG tablet Take 1 tablet (40 mg total) by mouth daily. 08/31/16  Yes Kathyrn Drown, MD  Multiple Vitamin (MULTIVITAMIN WITH MINERALS) TABS tablet Take 1 tablet by mouth daily.   Yes [provider]  omeprazole (PRILOSEC) 20 MG capsule Take 1 capsule (20 mg total) by mouth daily. 09/08/16  Yes Kathyrn Drown, MD  PRESCRIPTION MEDICATION Compound for headaches   Yes [provider]  ranitidine (ZANTAC) 150 MG tablet Take 150 mg by mouth at bedtime.   Yes [provider]  Na Sulfate-K Sulfate-Mg Sulf (SUPREP BOWEL PREP KIT) 17.5-3.13-1.6 GM/180ML SOLN Take 1 kit by mouth as directed. Patient not taking: Reported on 10/03/2016 09/03/16   Daneil Dolin, MD  polyethylene glycol-electrolytes (TRILYTE) 420 g solution Take 4,000 mLs by mouth as directed. Patient not taking: Reported on 08/30/2016 08/30/16   Daneil Dolin, MD  propranolol (INDERAL) 80 MG tablet Take 1 tablet (80 mg total) by mouth 3 (three) times daily. Patient not taking: Reported on 08/30/2016 08/26/13   Kathyrn Drown, MD    Allergies as of 08/30/2016 -  Review Complete 08/30/2016  Allergen Reaction Noted  . Penicillins  06/28/2012    Family History  Problem Relation Age of Onset  . Heart disease Father   . Prostate cancer Father   . Colon cancer Neg Hx   . Colon polyps Neg Hx     Social History   Social History  . Marital status: Married    Spouse name: N/A  . Number of children: N/A  . Years of education: N/A   Occupational History  . self-employed     [place] History Main Topics  . Smoking status: Former Research scientist (life sciences)  . Smokeless tobacco: Never Used  . Alcohol use 1.8 oz/week    3 Glasses of wine per week     Comment: 3 glasses wine/week; occ beer  . Drug use: No  . Sexual activity: Not on file   Other Topics Concern  . Not on file   Social History Narrative  . No narrative on file    Review of Systems: See HPI, otherwise negative ROS  Physical Exam: BP 131/86   Pulse 75   Temp 97.9 F (36.6 C) (Oral)   Resp 12   Ht 5' 6.5" (1.689 m)   Wt 151 lb (68.5 kg)   SpO2 95%   BMI 24.01 kg/m  General:   Alert,  Well-developed, well-nourished, pleasant and cooperative in NAD Neck:  Supple; no masses or thyromegaly. No significant  cervical adenopathy. Lungs:  Clear throughout to auscultation.   No wheezes, crackles, or rhonchi. No acute distress. Heart:  Regular rate and rhythm; no murmurs, clicks, rubs,  or gallops. Abdomen: Non-distended, normal bowel sounds.  Soft and nontender without appreciable mass or hepatosplenomegaly.  Pulses:  Normal pulses noted. Extremities:  Without clubbing or edema.  Impression:  Pleasant 66 year old gentleman LPR and globus. Globus symptoms ongoing. Intermittent rectal bleeding. EGD colonoscopy now being performed. Denies dysphagia.  Recommendations:  I have offered the patient both an EGD and colonoscopy today per plan.  The risks, benefits, limitations, imponderables and alternatives regarding both EGD and colonoscopy have been reviewed with the patient.  Questions have been answered. All parties agreeable.    Notice: This dictation was prepared with Dragon dictation along with smaller phrase technology. Any transcriptional errors that result from this process are unintentional and may not be corrected upon review.

## 2016-10-11 ENCOUNTER — Telehealth: Payer: Self-pay

## 2016-10-11 NOTE — Telephone Encounter (Signed)
Pt called office and had some questions after having his procedure with RMR 10/09/16. He said RMR increased his Omeprazole to twice a day and would like to know how he should take the Ranitidine. Informed him according to procedure note he should take the Ranitidine when necessary. He said he doesn't know when he should take the Ranitidine because the only symptom he is having occurs when he eats. He feels like something is there and describes it as "uncomfortable". He doesn't feel anything when he isn't eating. No trouble swallowing, no burning/hurting in chest.  He also has an upcoming appt with Dr. Benjamine Mola and wants to know if he should keep the appt since he has seen RMR. He wanted to speak to Vicente Males, but I told him she was on vacation and I would send message to RMR. He can be reached at [phone].

## 2016-10-12 ENCOUNTER — Encounter (HOSPITAL_COMMUNITY): Payer: Self-pay | Admitting: Internal Medicine

## 2016-10-12 ENCOUNTER — Encounter: Payer: Self-pay | Admitting: Internal Medicine

## 2016-10-12 NOTE — Telephone Encounter (Signed)
Continue omeprazole twice daily. No need to take ranitidine right now. Throat symptoms may or may not be related to reflux although he definitely has reflux. If they are related to reflux, may take 3-6 months to really settle down taking omeprazole twice daily. Keep upcoming appointment with [place]. Also, keep upcoming appointment with Dr. Benjamine Mola.  See pathology letter.

## 2016-10-15 ENCOUNTER — Encounter: Payer: Self-pay | Admitting: Family Medicine

## 2016-10-16 ENCOUNTER — Encounter (HOSPITAL_COMMUNITY): Payer: Self-pay | Admitting: Internal Medicine

## 2016-10-16 NOTE — Telephone Encounter (Signed)
Pt is aware. He is going to call or send [place] a message if he is not getting any better in about a month. He is aware of upcoming appt with [place].

## 2016-10-22 ENCOUNTER — Telehealth: Payer: Self-pay

## 2016-10-22 NOTE — Telephone Encounter (Signed)
PA done and sent to the insurance co.

## 2016-10-22 NOTE — Telephone Encounter (Signed)
Pt called office and said that RMR increased his Omeprazole to twice a day when he recently had EGD done. His insurance company [employer]hist[employer]ibility and immunogenetics)) contacted him and he will need prior authorization for the Omeprazole twice a day. Informed him I would send message to JL. Phone number for [place] Falling [phone] option 2, then 1.

## 2016-10-29 DIAGNOSIS — Z114 Encounter for screening for human immunodeficiency virus [HIV]: Secondary | ICD-10-CM | POA: Diagnosis not present

## 2016-10-29 DIAGNOSIS — Z1159 Encounter for screening for other viral diseases: Secondary | ICD-10-CM | POA: Diagnosis not present

## 2016-10-29 DIAGNOSIS — R69 Illness, unspecified: Secondary | ICD-10-CM | POA: Diagnosis not present

## 2016-10-29 DIAGNOSIS — E782 Mixed hyperlipidemia: Secondary | ICD-10-CM | POA: Diagnosis not present

## 2016-10-29 DIAGNOSIS — R972 Elevated prostate specific antigen [PSA]: Secondary | ICD-10-CM | POA: Diagnosis not present

## 2016-10-30 ENCOUNTER — Encounter: Payer: Self-pay | Admitting: Family Medicine

## 2016-10-30 LAB — HEPATIC FUNCTION PANEL
ALT: 20 IU/L (ref 0–44)
AST: 25 IU/L (ref 0–40)
Albumin: 4.8 g/dL (ref 3.6–4.8)
Alkaline Phosphatase: 91 IU/L (ref 39–117)
BILIRUBIN TOTAL: 0.4 mg/dL (ref 0.0–1.2)
BILIRUBIN, DIRECT: 0.12 mg/dL (ref 0.00–0.40)
TOTAL PROTEIN: 7.2 g/dL (ref 6.0–8.5)

## 2016-10-30 LAB — HIV ANTIBODY (ROUTINE TESTING W REFLEX): HIV Screen 4th Generation wRfx: NONREACTIVE

## 2016-10-30 LAB — LIPID PANEL
CHOL/HDL RATIO: 2.7 ratio (ref 0.0–5.0)
Cholesterol, Total: 172 mg/dL (ref 100–199)
HDL: 63 mg/dL (ref >39)
LDL CALC: 97 mg/dL (ref 0–99)
TRIGLYCERIDES: 59 mg/dL (ref 0–149)
VLDL Cholesterol Cal: 12 mg/dL (ref 5–40)

## 2016-10-30 LAB — HEPATITIS C ANTIBODY

## 2016-10-31 ENCOUNTER — Telehealth: Payer: Self-pay | Admitting: Family Medicine

## 2016-10-31 NOTE — Telephone Encounter (Signed)
Review lab results faxed from [facility] in results folder.

## 2016-11-01 ENCOUNTER — Encounter: Payer: Self-pay | Admitting: Family Medicine

## 2016-11-02 ENCOUNTER — Telehealth: Payer: Self-pay | Admitting: Family Medicine

## 2016-11-02 NOTE — Telephone Encounter (Signed)
Review lab results from [facility] in results folder.

## 2016-11-04 NOTE — Telephone Encounter (Signed)
Reviewed-[facility] is following PSA has gone down

## 2016-11-05 NOTE — Telephone Encounter (Signed)
Now 4.2 under the care of urology

## 2016-11-22 ENCOUNTER — Other Ambulatory Visit: Payer: Self-pay | Admitting: Family Medicine

## 2016-11-22 NOTE — Telephone Encounter (Signed)
May have 6 refills 

## 2016-12-17 ENCOUNTER — Encounter: Payer: Self-pay | Admitting: Family Medicine

## 2016-12-25 ENCOUNTER — Encounter: Payer: Self-pay | Admitting: Family Medicine

## 2016-12-25 ENCOUNTER — Ambulatory Visit (INDEPENDENT_AMBULATORY_CARE_PROVIDER_SITE_OTHER): Payer: Medicare HMO | Admitting: Family Medicine

## 2016-12-25 VITALS — BP 118/70 | Ht 65.5 in | Wt 160.8 lb

## 2016-12-25 DIAGNOSIS — B351 Tinea unguium: Secondary | ICD-10-CM | POA: Diagnosis not present

## 2016-12-25 DIAGNOSIS — R600 Localized edema: Secondary | ICD-10-CM | POA: Diagnosis not present

## 2016-12-25 NOTE — Progress Notes (Signed)
   Subjective:    Patient ID: Dylan Chapman, male    DOB: [birth year], 66 y.o.   MRN: [id number]  HPI  Patient arrives to discuss ankle swelling with hiking.He is noticed on 2 different occasions when he is gone hiking for long distances of greater than 7 miles he will have some swelling in his lower legs he denies any type of chest tightness pressure pain or shortness of breath no PND no orthopnea. Denies swelling in his abdomen. Does not drink excessive amount of fluid swelling does go down when he lays down at night but comes back the following day sometimes takes 5 or 6 days to go away he has never had this before he is tried nothing for it nothing seemed to improve      Patient would also like to discuss toe fungus. He relates that his toenails are getting white somewhat disfigured it does bother him it bothers his family as well he would like to have treatment for this. Denies any pain or infection.    Review of Systems  Constitutional: Negative for activity change, fatigue and fever.  Respiratory: Negative for cough and shortness of breath.   Cardiovascular: Negative for chest pain and leg swelling.  Gastrointestinal: Negative for abdominal pain.  Musculoskeletal: Negative for arthralgias.  Neurological: Negative for headaches.       Objective:   Physical Exam  Constitutional: He appears well-nourished. No distress.  Cardiovascular: Normal rate, regular rhythm and normal heart sounds.   No murmur heard. Pulmonary/Chest: Effort normal and breath sounds normal. No respiratory distress.  Musculoskeletal: He exhibits no edema.  Lymphadenopathy:    He has no cervical adenopathy.  Neurological: He is alert.  Psychiatric: His behavior is normal.  Vitals reviewed.   urinalysis negative for any protein.  probable toenail fungus noed n both eet more pominent on the left      Assessment & Plan:  Toenail fungus-long discussion held regarding Lamisil and potential risk including  liver toxicity blood toxicity patient will do's fungus cultures. Await the results of these can take up to a month. If he does go on the medicine OB for 12 weeks with liver testing at the start of medication and 6 weeks into the medicine.  Pedal edema probably related to venous insufficiency recommend knee-high support hose is if that does not work well enough we can prescribed knee-high compression stockings. No sign of any kidney dysfunction previous lab work from earlier this year looked good for liver and kidney if patient has progressive troubles or worse follow-up

## 2017-01-09 ENCOUNTER — Encounter: Payer: Self-pay | Admitting: Gastroenterology

## 2017-01-09 ENCOUNTER — Ambulatory Visit (INDEPENDENT_AMBULATORY_CARE_PROVIDER_SITE_OTHER): Payer: Medicare HMO | Admitting: Gastroenterology

## 2017-01-09 DIAGNOSIS — D369 Benign neoplasm, unspecified site: Secondary | ICD-10-CM

## 2017-01-09 DIAGNOSIS — K219 Gastro-esophageal reflux disease without esophagitis: Secondary | ICD-10-CM | POA: Insufficient documentation

## 2017-01-09 DIAGNOSIS — K2100 Gastro-esophageal reflux disease with esophagitis, without bleeding: Secondary | ICD-10-CM

## 2017-01-09 NOTE — Patient Instructions (Addendum)
We can try doing the Prilosec once each day, 30 minutes before breakfast. Continue Zantac every other evening.   We will see you back in 1 year!     Diastasis Recti Diastasis recti is when the muscles of the abdomen (rectus abdominis muscles) become thin and separate. The result is a wider space between the right and left abdomen (abdominal) muscles. This wider space between the muscles may cause a bulge in the middle of your abdomen. You may notice this bulge when you are straining or when you sit up from a lying down position. Diastasis recti can affect men and women. It is most common among pregnant women, infants, people who are obese, and people who have had abdominal surgery. Exercise or surgical treatment may help correct it. What are the causes? Common causes of this condition include:  Pregnancy. The growing uterus puts pressure on the abdominal muscles, which causes the muscles to separate.  Obesity. Excess fat puts pressure on abdominal muscles.  Weightlifting.  Some abdomen exercises.  Advanced age.  Genetics.  Prior abdominal surgery.  What increases the risk? This condition is more likely to develop in:  Women.  Newborns, especially newborns who are born early (prematurely).  What are the signs or symptoms? Common symptoms of this condition include:  A bulge in the middle of the abdomen. You will notice it most when you sit up or strain.  Pain in the low back, pelvis, or hips.  Constipation.  Inability to control when you urinate (urinary incontinence).  Bloating.  Poor posture.  How is this diagnosed? This condition is diagnosed with a physical exam. Your health care provider will ask you to lie flat on your back and do a crunch or half sit-up. If you have diastasis recti, a vertical bulge will appear between your abdominal muscles in the center of your abdomen. Your health care provider will measure the gap between your muscles with one of the  following:  A medical device used to measure the space between two objects (caliper).  A tape measure.  CT scan.  Ultrasound.  Finger spaces. Your health care provider will measure the space using their fingers.  How is this treated? If your muscle separation is not too large, you may not need treatment. However, if you are a woman who plans to become pregnant again, you should treat this condition before your next pregnancy. Treatment may include:  Physical therapy to strengthen and tighten your abdominal muscles.  Lifestyle changes such as weight loss and exercise.  Over-the-counter pain medicines as needed.  Surgery to correct the separation.  Follow these instructions at home: Activity  Return to your normal activities as told by your health care provider. Ask your health care provider what activities are safe for you.  When lifting weights or doing exercises using your abdominal muscles or the muscles in the center of your body that give stability (core muscles), make sure you are doing your exercises and movements correctly. Proper form can help to prevent the condition from happening again. General instructions  If you are overweight, ask your health care provider for help with weight loss. Losing even a small amount of weight can help to improve your diastasis recti.  Take over-the-counter or prescription medicines only as told by your health care provider.  Do not strain. Straining can make the separation worse. Examples of straining include: ? Pushing hard to have a bowel movement, such as due to constipation. ? Lifting heavy objects, including children. ?  Standing up and sitting down.  Take steps to prevent constipation: ? Drink enough fluid to keep your urine clear or pale yellow. ? Take over-the-counter or prescription medicines only as directed. ? Eat foods that are high in fiber, such as fresh fruits and vegetables, whole grains, and beans. ? Limit foods  that are high in fat and processed sugars, such as fried and sweet foods. Contact a health care provider if:  You notice a new bulge in your abdomen. Get help right away if:  You experience severe discomfort in your abdomen.  You develop severe abdominal pain along with nausea, vomiting, or fever. Summary  Diastasis recti is when the abdomen (abdominal) muscles become thin and separate. Your abdomen will stick out because the space between your right and left abdomen muscles has widened.  The most common symptom is a bulge in your abdomen. You will notice it most when you sit up or are straining.  This condition is diagnosed during a physical exam.  If the abdomen separation is not too big, you may choose not to have treatment. Otherwise, you may need to undergo physical therapy or surgery. This information is not intended to replace advice given to you by your health care provider. Make sure you discuss any questions you have with your health care provider. Document Released: 06/04/2016 Document Revised: 06/04/2016 Document Reviewed: 06/04/2016 Elsevier Interactive Patient Education  [employer].

## 2017-01-09 NOTE — Progress Notes (Signed)
Referring Provider: Kathyrn Drown, MD Primary Care Physician:  Kathyrn Drown, MD Primary GI: Dr. Gala Romney   Chief Complaint  Patient presents with  . Rectal Bleeding    doing better    HPI:   Dylan Chapman is a 66 y.o. male presenting today with a history of rectal bleeding and globus sensation. Recently underwent colonoscopy with internal hemorrhoids noted, tubular adenoma with surveillance needed in 7 years. EGD done as well with LA Grade A esophagitis.    Globus sensation improved. Prilosec BID and Zantac at night. No rectal bleeding. No constipation. Preparing for a trip to the [place] next year with his daughter. Concerned about a possible hernia.    Past Medical History:  Diagnosis Date  . Hyperlipidemia   . Migraines     Past Surgical History:  Procedure Laterality Date  . BACK SURGERY    . COLONOSCOPY  2007   Dr. Gala Romney: normal rectum and scattered sigmoid diverticulum   . COLONOSCOPY N/A 10/09/2016   Dr. Gala Romney: non-bleeding internal hemorrhoids, diverticulosis in sigmoid and descending colon, one 4 mm tubular adenoma at recto-sigmoid s/p cold snare removal. Surveillance in 7 years  . ESOPHAGOGASTRODUODENOSCOPY N/A 10/09/2016   Dr. Gala Romney: LA Grade A esophagitis, normal stomach, normal ampulla and second portion of duodenum, no specimens collected  . HERNIA REPAIR Left    left inguinal  . MALONEY DILATION N/A 10/09/2016   Procedure: Venia Minks DILATION;  Surgeon: Daneil Dolin, MD;  Location: [facility];  Service: Endoscopy;  Laterality: N/A;  . SHOULDER SURGERY      Current Outpatient Prescriptions  Medication Sig Dispense Refill  . aspirin 81 MG tablet Take 81 mg by mouth daily.    Marland Kitchen atorvastatin (LIPITOR) 40 MG tablet Take 1 tablet (40 mg total) by mouth daily. 90 tablet 1  . Multiple Vitamin (MULTIVITAMIN WITH MINERALS) TABS tablet Take 1 tablet by mouth daily.    Marland Kitchen omeprazole (PRILOSEC) 20 MG capsule Take 1 capsule (20 mg total) by mouth daily.  (Patient taking differently: Take 20 mg by mouth 2 (two) times daily before a meal. ) 90 capsule 3  . PRESCRIPTION MEDICATION Compound for headaches    . ranitidine (ZANTAC) 150 MG tablet Take 150 mg by mouth at bedtime.    . Ketoprofen POWD AS DIRECTED. (Patient not taking: Reported on 01/09/2017) 60 g 5   No current facility-administered medications for this visit.     Allergies as of 01/09/2017 - Review Complete 01/09/2017  Allergen Reaction Noted  . Penicillins Hives 06/28/2012    Family History  Problem Relation Age of Onset  . Heart disease Father   . Prostate cancer Father   . Colon cancer Neg Hx   . Colon polyps Neg Hx     Social History   Social History  . Marital status: Married    Spouse name: N/A  . Number of children: N/A  . Years of education: N/A   Occupational History  . self-employed     [place] History Main Topics  . Smoking status: Former Research scientist (life sciences)  . Smokeless tobacco: Never Used  . Alcohol use 1.8 oz/week    3 Glasses of wine per week     Comment: 3 glasses wine/week; occ beer  . Drug use: No  . Sexual activity: Not Asked   Other Topics Concern  . None   Social History Narrative  . None    Review of Systems:  Gen: Denies fever, chills, anorexia. Denies fatigue, weakness, weight loss.  CV: Denies chest pain, palpitations, syncope, peripheral edema, and claudication. Resp: Denies dyspnea at rest, cough, wheezing, coughing up blood, and pleurisy. GI: see HPI  Derm: Denies rash, itching, dry skin Psych: Denies depression, anxiety, memory loss, confusion. No homicidal or suicidal ideation.  Heme: Denies bruising, bleeding, and enlarged lymph nodes.  Physical Exam: BP 122/81   Pulse 73   Temp 98.3 F (36.8 C) (Oral)   Ht 5' 6.5" (1.689 m)   Wt 159 lb 9.6 oz (72.4 kg)   BMI 25.37 kg/m  General:   Alert and oriented. No distress noted. Pleasant and cooperative.  Head:  Normocephalic and atraumatic. Eyes:  Conjuctiva  clear without scleral icterus. Mouth:  Oral mucosa pink and moist.  Abdomen:  +BS, soft, non-tender and non-distended. No rebound or guarding. Diastasis recti noted on exam with raising head  Msk:  Symmetrical without gross deformities. Normal posture. Extremities:  Without edema. Neurologic:  Alert and  oriented x4 Psych:  Alert and cooperative. Normal mood and affect.

## 2017-01-09 NOTE — Progress Notes (Signed)
cc'ed to pcp °

## 2017-01-09 NOTE — Assessment & Plan Note (Signed)
On colonoscopy 2018. Next colonoscopy in 7 years.

## 2017-01-09 NOTE — Assessment & Plan Note (Signed)
66 year old with esophagitis noted on recent EGD. Globus sensation improved with BID dosing. Will trial Prilosec once daily. If needed, may return to BID. Zantac prn.  As of note, appears he has diastasis recti on exam. Discussed behavior modification/treatment. Would not favor imaging unless symptomatic. Currently, he has no issues. Continue exercise and weight loss efforts.   Return in 1 year.

## 2017-01-10 ENCOUNTER — Encounter: Payer: Self-pay | Admitting: Family Medicine

## 2017-01-16 DIAGNOSIS — B351 Tinea unguium: Secondary | ICD-10-CM | POA: Diagnosis not present

## 2017-02-07 ENCOUNTER — Telehealth: Payer: Self-pay | Admitting: Family Medicine

## 2017-02-07 ENCOUNTER — Encounter: Payer: Self-pay | Admitting: Family Medicine

## 2017-02-07 NOTE — Telephone Encounter (Signed)
Please inform the patient that we have received his mother class regarding travel. Typically that is a office visit to be able to do all this but I will look into what he has stated and by next week be able to provide the prescriptions and save him an office visit

## 2017-02-08 ENCOUNTER — Telehealth: Payer: Self-pay | Admitting: Family Medicine

## 2017-02-08 NOTE — Telephone Encounter (Signed)
Nurses disregard previous message- simplty let pt know I am out and will be back Monday so he should hear something by Tues or Weds- please tell him travel medicine and vaccines are often handled by a travel clinic which is at [facility] in [place] but I will review the info he sent early next week

## 2017-02-08 NOTE — Telephone Encounter (Signed)
Spoke with patient and informed him per Dr.Ival Luking- simplty let pt know I am out and will be back Monday so he should hear something by Tues or Weds- please tell him travel medicine and vaccines are often handled by a travel clinic which is at [facility] in [place] but I will review the info he sent early next week. Patient verbalized understanding. Hoyle Sauer overheard patient call and stated that he can go to [facility] and they will give the recommended vaccines for the area he is going to and if they don't have it they will send him to [facility] drug for the rest.

## 2017-02-11 NOTE — Telephone Encounter (Signed)
His prescriptions were written out as requested-please let the patient know I have sent him my chart messages and he can pick up the prescriptions here at the office

## 2017-02-11 NOTE — Telephone Encounter (Signed)
Patient notified and stated he did receive the messages from Dr [name].

## 2017-02-25 DIAGNOSIS — R972 Elevated prostate specific antigen [PSA]: Secondary | ICD-10-CM | POA: Diagnosis not present

## 2017-02-25 LAB — FUNGUS CULTURE W SMEAR

## 2017-02-26 ENCOUNTER — Other Ambulatory Visit: Payer: Self-pay | Admitting: Family Medicine

## 2017-02-27 MED ORDER — TERBINAFINE HCL 250 MG PO TABS: 250.0000 mg | ORAL_TABLET | Freq: Every day | ORAL | 2 refills | Status: DC

## 2017-02-27 NOTE — Addendum Note (Signed)
Addended by: Ofilia Neas R on: 02/27/2017 01:37 PM   Modules accepted: Orders

## 2017-02-28 DIAGNOSIS — B351 Tinea unguium: Secondary | ICD-10-CM | POA: Diagnosis not present

## 2017-03-01 ENCOUNTER — Ambulatory Visit: Payer: Medicare HMO | Admitting: Urology

## 2017-03-01 DIAGNOSIS — R972 Elevated prostate specific antigen [PSA]: Secondary | ICD-10-CM

## 2017-03-01 LAB — HEPATIC FUNCTION PANEL
ALK PHOS: 91 IU/L (ref 39–117)
ALT: 18 IU/L (ref 0–44)
AST: 24 IU/L (ref 0–40)
Albumin: 4.5 g/dL (ref 3.6–4.8)
BILIRUBIN, DIRECT: 0.17 mg/dL (ref 0.00–0.40)
Bilirubin Total: 0.7 mg/dL (ref 0.0–1.2)
Total Protein: 6.9 g/dL (ref 6.0–8.5)

## 2017-04-23 ENCOUNTER — Encounter: Payer: Self-pay | Admitting: Family Medicine

## 2017-04-28 ENCOUNTER — Telehealth: Payer: Self-pay | Admitting: Family Medicine

## 2017-04-28 NOTE — Telephone Encounter (Signed)
Patient states he had Tet shot 03/03/17 at [facility] update his shot records thanks

## 2017-04-29 NOTE — Telephone Encounter (Signed)
done

## 2017-05-28 DIAGNOSIS — R69 Illness, unspecified: Secondary | ICD-10-CM | POA: Diagnosis not present

## 2017-07-22 ENCOUNTER — Other Ambulatory Visit: Payer: Self-pay | Admitting: Family Medicine

## 2017-07-22 ENCOUNTER — Telehealth: Payer: Self-pay | Admitting: Family Medicine

## 2017-07-22 DIAGNOSIS — Z0000 Encounter for general adult medical examination without abnormal findings: Secondary | ICD-10-CM

## 2017-07-22 DIAGNOSIS — E785 Hyperlipidemia, unspecified: Secondary | ICD-10-CM

## 2017-07-22 DIAGNOSIS — R7989 Other specified abnormal findings of blood chemistry: Secondary | ICD-10-CM

## 2017-07-22 NOTE — Telephone Encounter (Signed)
Lipid, liver, metabolic 7, TSH, CBC, PSA- elevated TSH, hyperlipidemia, screening

## 2017-07-22 NOTE — Telephone Encounter (Signed)
Labs placed in Epic; pt notified

## 2017-07-22 NOTE — Telephone Encounter (Signed)
Patient has wellness 5/16 and requesting labs.

## 2017-07-23 ENCOUNTER — Telehealth: Payer: Self-pay

## 2017-07-23 ENCOUNTER — Encounter: Payer: Self-pay | Admitting: Family Medicine

## 2017-07-23 ENCOUNTER — Encounter: Payer: Self-pay | Admitting: Gastroenterology

## 2017-07-23 NOTE — Telephone Encounter (Signed)
PA was started on [email or web address] for Omeprazole. Waiting on approval or denial.

## 2017-07-24 NOTE — Telephone Encounter (Signed)
Pt was notified of approval

## 2017-07-24 NOTE — Telephone Encounter (Signed)
Received an approval for Omeprazole 20mg , effective 04/23/17-04/22/18.

## 2017-08-22 ENCOUNTER — Other Ambulatory Visit: Payer: Self-pay | Admitting: Family Medicine

## 2017-08-26 DIAGNOSIS — R7989 Other specified abnormal findings of blood chemistry: Secondary | ICD-10-CM | POA: Diagnosis not present

## 2017-08-26 DIAGNOSIS — E785 Hyperlipidemia, unspecified: Secondary | ICD-10-CM | POA: Diagnosis not present

## 2017-08-26 DIAGNOSIS — Z0000 Encounter for general adult medical examination without abnormal findings: Secondary | ICD-10-CM | POA: Diagnosis not present

## 2017-08-27 LAB — BASIC METABOLIC PANEL
BUN / CREAT RATIO: 21 (ref 10–24)
BUN: 22 mg/dL (ref 8–27)
CHLORIDE: 105 mmol/L (ref 96–106)
CO2: 24 mmol/L (ref 20–29)
CREATININE: 1.07 mg/dL (ref 0.76–1.27)
Calcium: 9.3 mg/dL (ref 8.6–10.2)
GFR calc Af Amer: 83 mL/min/{1.73_m2} (ref >59)
GFR calc non Af Amer: 72 mL/min/{1.73_m2} (ref >59)
GLUCOSE: 101 mg/dL — AB (ref 65–99)
Potassium: 4.6 mmol/L (ref 3.5–5.2)
SODIUM: 142 mmol/L (ref 134–144)

## 2017-08-27 LAB — CBC WITH DIFFERENTIAL/PLATELET
BASOS ABS: 0.1 10*3/uL (ref 0.0–0.2)
Basos: 1 %
EOS (ABSOLUTE): 0.2 10*3/uL (ref 0.0–0.4)
EOS: 3 %
HEMATOCRIT: 41.3 % (ref 37.5–51.0)
HEMOGLOBIN: 13.8 g/dL (ref 13.0–17.7)
IMMATURE GRANULOCYTES: 0 %
Immature Grans (Abs): 0 10*3/uL (ref 0.0–0.1)
LYMPHS ABS: 1.7 10*3/uL (ref 0.7–3.1)
Lymphs: 31 %
MCH: 29.5 pg (ref 26.6–33.0)
MCHC: 33.4 g/dL (ref 31.5–35.7)
MCV: 88 fL (ref 79–97)
MONOS ABS: 0.6 10*3/uL (ref 0.1–0.9)
Monocytes: 12 %
Neutrophils Absolute: 2.9 10*3/uL (ref 1.4–7.0)
Neutrophils: 53 %
Platelets: 297 10*3/uL (ref 150–379)
RBC: 4.68 x10E6/uL (ref 4.14–5.80)
RDW: 13.5 % (ref 12.3–15.4)
WBC: 5.4 10*3/uL (ref 3.4–10.8)

## 2017-08-27 LAB — LIPID PANEL
CHOLESTEROL TOTAL: 151 mg/dL (ref 100–199)
Chol/HDL Ratio: 2.6 ratio (ref 0.0–5.0)
HDL: 58 mg/dL (ref >39)
LDL CALC: 81 mg/dL (ref 0–99)
TRIGLYCERIDES: 58 mg/dL (ref 0–149)
VLDL CHOLESTEROL CAL: 12 mg/dL (ref 5–40)

## 2017-08-27 LAB — HEPATIC FUNCTION PANEL
ALBUMIN: 4.2 g/dL (ref 3.6–4.8)
ALT: 17 IU/L (ref 0–44)
AST: 23 IU/L (ref 0–40)
Alkaline Phosphatase: 79 IU/L (ref 39–117)
Bilirubin Total: 0.7 mg/dL (ref 0.0–1.2)
Bilirubin, Direct: 0.2 mg/dL (ref 0.00–0.40)
TOTAL PROTEIN: 6.4 g/dL (ref 6.0–8.5)

## 2017-08-27 LAB — PSA: Prostate Specific Ag, Serum: 3.4 ng/mL (ref 0.0–4.0)

## 2017-08-27 LAB — TSH: TSH: 5.69 u[IU]/mL — AB (ref 0.450–4.500)

## 2017-09-05 ENCOUNTER — Encounter: Payer: Self-pay | Admitting: Family Medicine

## 2017-09-05 ENCOUNTER — Ambulatory Visit (INDEPENDENT_AMBULATORY_CARE_PROVIDER_SITE_OTHER): Payer: Medicare HMO | Admitting: Family Medicine

## 2017-09-05 VITALS — BP 138/84 | Ht 66.5 in | Wt 157.2 lb

## 2017-09-05 DIAGNOSIS — E039 Hypothyroidism, unspecified: Secondary | ICD-10-CM

## 2017-09-05 DIAGNOSIS — Z0000 Encounter for general adult medical examination without abnormal findings: Secondary | ICD-10-CM

## 2017-09-05 DIAGNOSIS — L570 Actinic keratosis: Secondary | ICD-10-CM | POA: Diagnosis not present

## 2017-09-05 DIAGNOSIS — R7989 Other specified abnormal findings of blood chemistry: Secondary | ICD-10-CM | POA: Diagnosis not present

## 2017-09-05 DIAGNOSIS — R252 Cramp and spasm: Secondary | ICD-10-CM

## 2017-09-05 DIAGNOSIS — K439 Ventral hernia without obstruction or gangrene: Secondary | ICD-10-CM | POA: Diagnosis not present

## 2017-09-05 DIAGNOSIS — Z0001 Encounter for general adult medical examination with abnormal findings: Secondary | ICD-10-CM | POA: Diagnosis not present

## 2017-09-05 DIAGNOSIS — E7849 Other hyperlipidemia: Secondary | ICD-10-CM | POA: Diagnosis not present

## 2017-09-05 DIAGNOSIS — E782 Mixed hyperlipidemia: Secondary | ICD-10-CM

## 2017-09-05 DIAGNOSIS — Z23 Encounter for immunization: Secondary | ICD-10-CM

## 2017-09-05 MED ORDER — PNEUMOCOCCAL VAC POLYVALENT 25 MCG/0.5ML IJ INJ
0.5000 mL | INJECTION | Freq: Once | INTRAMUSCULAR | Status: AC
  Administered 2017-09-05: 0.5 mL via INTRAMUSCULAR

## 2017-09-05 NOTE — Progress Notes (Signed)
Per Medication History Propranolol 80 mg Three times daily.# 30

## 2017-09-05 NOTE — Progress Notes (Signed)
Subjective:    Patient ID: Dylan Chapman, male    DOB: October 29, 1950, 67 y.o.   MRN: [id number]  HPI AWV- Annual Wellness Visit  The patient was seen for their annual wellness visit. The patient's past medical history, surgical history, and family history were reviewed. Pertinent vaccines were reviewed ( tetanus, pneumonia, shingles, flu) The patient's medication list was reviewed and updated.  The height and weight were entered.  BMI recorded in electronic record elsewhere  Cognitive screening was completed. Outcome of Mini - Cog: Pass   Falls /depression screening electronically recorded within record elsewhere  Current tobacco usage:no (All patients who use tobacco were given written and verbal information on quitting)  Recent listing of emergency department/hospitalizations over the past year were reviewed.  current specialist the patient sees on a regular basis: no    Medicare annual wellness visit patient questionnaire was reviewed.  A written screening schedule for the patient for the next 5-10 years was given. Appropriate discussion of followup regarding next visit was discussed.   (Pt states Headache compound comes from [place], but all other med comes from [facility] in [place]) Headaches have been under control he just asked for refills medicine  Not having any symptoms of hypothyroidism but TSH slightly up  His mother has a history of hypothyroidism but he does not  Hyperlipidemia takes his medication recent lab work looked good tries to eat healthy stays physically active has lost some weight  Patient relates having a dry area on the right side of his nose at crusting.  Sometimes does not bleed no tenderness to it present for the past.   Patient with significant muscle cramps intermittently sometimes in the legs sometimes in the feet been going on for a while wonders if there is anything he can take muscle Review of Systems  Constitutional: Negative for activity  change, appetite change and fever.  HENT: Negative for congestion and rhinorrhea.   Eyes: Negative for discharge.  Respiratory: Negative for cough and wheezing.   Cardiovascular: Negative for chest pain.  Gastrointestinal: Negative for abdominal pain, blood in stool and vomiting.  Genitourinary: Negative for difficulty urinating and frequency.  Musculoskeletal: Negative for neck pain.  Skin: Negative for rash.  Allergic/Immunologic: Negative for environmental allergies and food allergies.  Neurological: Positive for headaches. Negative for weakness.  Psychiatric/Behavioral: Negative for agitation.       Objective:   Physical Exam  Constitutional: He appears well-developed and well-nourished. No distress.  HENT:  Head: Normocephalic and atraumatic.  Right Ear: External ear normal.  Left Ear: External ear normal.  Nose: Nose normal.  Mouth/Throat: Oropharynx is clear and moist.  Eyes: Right eye exhibits no discharge. Left eye exhibits no discharge.  Neck: No tracheal deviation present.  Cardiovascular: Normal rate, regular rhythm and normal heart sounds.  No murmur heard. Pulmonary/Chest: Effort normal and breath sounds normal. No respiratory distress. He has no wheezes. He has no rales.  Abdominal: Soft. Bowel sounds are normal. He exhibits no distension and no mass. There is no tenderness.  Genitourinary: Prostate normal and penis normal.  Musculoskeletal: Normal range of motion. He exhibits no edema.  Lymphadenopathy:    He has no cervical adenopathy.  Neurological: He is alert. He exhibits normal muscle tone.  Skin: Skin is warm and dry. No erythema.  Psychiatric: He has a normal mood and affect. His behavior is normal. Judgment normal.  Vitals reviewed. Ventral hernia noted in the upper abdomen no sign of inguinal hernia.  Testicular exam  normal.  Prostate exam normal.       Assessment & Plan:  Adult wellness-complete.wellness physical was conducted today. Importance of  diet and exercise were discussed in detail.  In addition to this a discussion regarding safety was also covered. We also reviewed over immunizations and gave recommendations regarding current immunization needed for age.  In addition to this additional areas were also touched on including: Elevated TSH, ramifications and follow-up with this, discussion of hyperlipidemia, continuation of medications, refill of headache medications.,  Discussion of travel to [place] Preventative health exams needed: Colonoscopy up-to-date on colonoscopy-next 2023  Patient was advised yearly wellness exam  Hyperlipidemia Continue medication Lab work looks good Media planner closely  Elevated TSH-subclinical hypothyroidism recheck lab work again in 2 months if progressive consider starting low-dose thyroid medicine  Pneumococcal 23 today  Shingrix discussed he will check in to see if it is covered by his insurance he had Zostavax 2 years ago  We discussed measles vaccine I do not feel it is necessary based on his date of birth  Spasms recommend to do stretching exercises this should gradually help  Does not have enough risk factors to continue aspirin, I recommend the patient to stop taking aspirin  PSA normal.  Prostate exam normal.  Follow-up again in 1 year.  Discussion held with patient regarding upcoming trip to [place] He will contact us closer to the trip for antibiotics for possible treatment for traveler's diarrhea and any respiratory illness  Chronic headaches under decent control currently  Ventral hernia no surgery indicated if progressive troubles or worse follow-up  Actinic keratosis possible pre-skin cancer recommend referral to dermatology.  Patient has concerns about memory in the past mini cog I recommend this patient do a's regular office visit to specifically discuss memory issues these type of discussions can easily take 25 minutes it would not be fair to the patient to short change the  effort today given that we have spent significant amount of time covering multiple issues in addition to his wellness

## 2017-09-06 ENCOUNTER — Telehealth: Payer: Self-pay | Admitting: Family Medicine

## 2017-09-06 ENCOUNTER — Other Ambulatory Visit: Payer: Self-pay | Admitting: Family Medicine

## 2017-09-06 MED ORDER — PROPRANOLOL HCL 80 MG PO TABS: ORAL_TABLET | ORAL | 6 refills | Status: DC

## 2017-09-06 NOTE — Progress Notes (Signed)
Please see several other messages that have been sent regarding this patient.  Based on historical findings within the medical list-please send in prescription for propranolol 80 mg, 1 3 times daily as needed cluster headaches, #30, 6 refills thank you-this will save you a phone call to the patient.

## 2017-09-06 NOTE — Progress Notes (Signed)
Spoke with pt regarding dermatology referral. Pt stated he would look around and if he needed [place] to get involved, he would give [place] a call.

## 2017-09-06 NOTE — Progress Notes (Signed)
Sent med into [facility], pt notified

## 2017-09-06 NOTE — Progress Notes (Signed)
Spoke with pt

## 2017-09-17 DIAGNOSIS — L578 Other skin changes due to chronic exposure to nonionizing radiation: Secondary | ICD-10-CM | POA: Diagnosis not present

## 2017-09-17 DIAGNOSIS — L570 Actinic keratosis: Secondary | ICD-10-CM | POA: Diagnosis not present

## 2017-10-02 ENCOUNTER — Encounter: Payer: Self-pay | Admitting: Family Medicine

## 2017-10-15 DIAGNOSIS — Z0100 Encounter for examination of eyes and vision without abnormal findings: Secondary | ICD-10-CM | POA: Diagnosis not present

## 2017-10-17 DIAGNOSIS — R7989 Other specified abnormal findings of blood chemistry: Secondary | ICD-10-CM | POA: Diagnosis not present

## 2017-10-18 LAB — T3: T3, Total: 89 ng/dL (ref 71–180)

## 2017-10-18 LAB — TSH: TSH: 5.61 u[IU]/mL — ABNORMAL HIGH (ref 0.450–4.500)

## 2017-10-18 LAB — T4, FREE: FREE T4: 1.03 ng/dL (ref 0.82–1.77)

## 2017-10-25 NOTE — Addendum Note (Signed)
Addended by: Dairl Ponder on: 10/25/2017 11:09 AM   Modules accepted: Orders

## 2017-10-31 ENCOUNTER — Encounter: Payer: Self-pay | Admitting: Gastroenterology

## 2017-10-31 ENCOUNTER — Telehealth: Payer: Self-pay | Admitting: Internal Medicine

## 2017-10-31 NOTE — Telephone Encounter (Signed)
PLEASE CALL PATIENT, HE IS WANTING TO KNOW THE COST OF A BANDING DONE IN THE OFFICE.  ASKED FOR THE CODES SO HE COULD CONTACT HIS INSURANCE.

## 2017-11-01 ENCOUNTER — Encounter (INDEPENDENT_AMBULATORY_CARE_PROVIDER_SITE_OTHER): Payer: Self-pay

## 2017-11-04 NOTE — Telephone Encounter (Signed)
I provided the patient with the [phone]) for banding.

## 2017-11-06 NOTE — Telephone Encounter (Signed)
Patient called back and stated he spoke with his insurance company and they confirmed that he will only have to pay $200 for his banding.  He spoke with [place] [id number]

## 2017-12-16 DIAGNOSIS — R69 Illness, unspecified: Secondary | ICD-10-CM | POA: Diagnosis not present

## 2017-12-17 ENCOUNTER — Encounter: Payer: Self-pay | Admitting: Internal Medicine

## 2017-12-17 ENCOUNTER — Ambulatory Visit: Payer: Medicare HMO | Admitting: Internal Medicine

## 2017-12-17 ENCOUNTER — Other Ambulatory Visit: Payer: Self-pay | Admitting: Family Medicine

## 2017-12-17 ENCOUNTER — Other Ambulatory Visit: Payer: Self-pay | Admitting: *Deleted

## 2017-12-17 VITALS — BP 152/74 | HR 71 | Temp 98.7°F | Ht 66.5 in | Wt 156.6 lb

## 2017-12-17 DIAGNOSIS — K648 Other hemorrhoids: Secondary | ICD-10-CM | POA: Diagnosis not present

## 2017-12-17 MED ORDER — KETOPROFEN POWD: 2 refills | Status: DC

## 2017-12-17 NOTE — Telephone Encounter (Signed)
May have 3 refills 

## 2017-12-17 NOTE — Progress Notes (Signed)
[place] banding procedure note:  The patient presents with symptomatic hemorrhoids, unresponsive to maximal medical therapy, requesting rubber band ligation of his/her hemorrhoidal disease. All risks, benefits, and alternative forms of therapy were described and informed consent was obtained.  In the left lateral decubitus position, anoscopy revealed somewhat engorged right anterior and posterior columns.  The decision was made to band the right anterior internal hemorrhoid, and the [place] was used to perform band ligation without complication. Digital anorectal examination was then performed to assure proper positioning of the band;  Band found to be in excellent position.  The decision was made to band the right posterior column.  Band found be in excellent position.   The patient was discharged home without pain or other issues. Dietary and behavioral recommendations were given.   The patient will return in 6 weeks  for followup and possible additional banding as required.  No complications were encountered and the patient tolerated the procedure well.

## 2017-12-17 NOTE — Patient Instructions (Signed)
Avoid straining.  Benefiber 1 tablespoon daily  Limit toilet time to 5 minutes  Call with any interim problems  Schedule followup appointment in early November.

## 2018-01-07 ENCOUNTER — Encounter: Payer: Self-pay | Admitting: Family Medicine

## 2018-01-08 ENCOUNTER — Other Ambulatory Visit: Payer: Self-pay | Admitting: Family Medicine

## 2018-01-08 MED ORDER — AZITHROMYCIN 250 MG PO TABS: ORAL_TABLET | ORAL | 1 refills | Status: DC

## 2018-01-17 ENCOUNTER — Telehealth: Payer: Self-pay | Admitting: Family Medicine

## 2018-01-17 ENCOUNTER — Other Ambulatory Visit: Payer: Self-pay | Admitting: Family Medicine

## 2018-01-17 MED ORDER — DOXYCYCLINE HYCLATE 100 MG PO TABS: 100.0000 mg | ORAL_TABLET | Freq: Two times a day (BID) | ORAL | 0 refills | Status: DC

## 2018-01-17 NOTE — Telephone Encounter (Signed)
Patient relates that his cat claw cut into the palm of his hand.  Patient happened to be at my house-this does not appear to need stitches.  The patient had was cleaned this well.  Patient allergic to penicillin.  Recommend doxycycline 100 mg 1 twice daily for the next 10 days Patient warned regarding possibility of hand infection.

## 2018-01-20 ENCOUNTER — Telehealth: Payer: Self-pay | Admitting: Internal Medicine

## 2018-01-20 ENCOUNTER — Other Ambulatory Visit: Payer: Self-pay | Admitting: Internal Medicine

## 2018-01-20 NOTE — Telephone Encounter (Signed)
Pt notified that RX was sent.  

## 2018-01-20 NOTE — Telephone Encounter (Signed)
Noted  

## 2018-01-20 NOTE — Telephone Encounter (Signed)
PATIENT CALLED AND SAID HIS PHARMACY IS SENDING A REFILL REQUEST FOR HIS OMEPRAZOLE AND HE NEEDS IT FILLED TODAY

## 2018-01-20 NOTE — Telephone Encounter (Signed)
I just completed it before you sent this :)

## 2018-01-20 NOTE — Telephone Encounter (Signed)
Refill request wasn't received. Pt would like RX filled today. Pt says he has to have it today.

## 2018-02-03 ENCOUNTER — Encounter: Payer: Self-pay | Admitting: Internal Medicine

## 2018-02-09 NOTE — Telephone Encounter (Signed)
error 

## 2018-02-18 ENCOUNTER — Encounter: Payer: Self-pay | Admitting: Family Medicine

## 2018-02-18 ENCOUNTER — Ambulatory Visit (INDEPENDENT_AMBULATORY_CARE_PROVIDER_SITE_OTHER): Payer: Medicare HMO | Admitting: Family Medicine

## 2018-02-18 VITALS — BP 134/86 | Temp 98.2°F | Ht 66.5 in | Wt 148.2 lb

## 2018-02-18 DIAGNOSIS — J069 Acute upper respiratory infection, unspecified: Secondary | ICD-10-CM | POA: Diagnosis not present

## 2018-02-18 DIAGNOSIS — B9789 Other viral agents as the cause of diseases classified elsewhere: Secondary | ICD-10-CM | POA: Diagnosis not present

## 2018-02-18 NOTE — Progress Notes (Signed)
   Subjective:    Patient ID: Dylan Chapman, male    DOB: Sep 05, 1950, 67 y.o.   MRN: [id number]  Cough  This is a new problem. The current episode started 1 to 4 weeks ago. Episode frequency: morning is worse; not constant. Associated symptoms include rhinorrhea. Pertinent negatives include no chest pain, chills, ear pain, fever or wheezing. Associated symptoms comments: Feels like something in throat; has coughed up phelgm, no color. He has tried nothing for the symptoms.    Pt was recently in [place]. Pt states he was hiking in [place]. Pt states people that were with him on trip also have cough She denies fever sweats weight loss denies muscle pain in his legs or swelling in the legs denies pleuritic pain denies hemoptysis.  Patient mainly has a nagging cough which started 5 days after his adventure in [place] it is been going on for several weeks. Review of Systems  Constitutional: Negative for activity change, chills and fever.  HENT: Positive for congestion and rhinorrhea. Negative for ear pain.   Eyes: Negative for discharge.  Respiratory: Positive for cough. Negative for wheezing.   Cardiovascular: Negative for chest pain.  Gastrointestinal: Negative for nausea and vomiting.  Musculoskeletal: Negative for arthralgias.       Objective:   Physical Exam  Constitutional: He appears well-developed.  HENT:  Head: Normocephalic.  Mouth/Throat: Oropharynx is clear and moist. No oropharyngeal exudate.  Neck: Normal range of motion.  Cardiovascular: Normal rate, regular rhythm and normal heart sounds.  No murmur heard. Pulmonary/Chest: Effort normal and breath sounds normal. He has no wheezes.  Lymphadenopathy:    He has no cervical adenopathy.  Neurological: He exhibits normal muscle tone.  Skin: Skin is warm and dry.  Nursing note and vitals reviewed.         Assessment & Plan:  Frequent cough Chronic cough Related to the trip to [place] More than likely viral illness Hold off on  lab work and x-rays at this time Hold off on antibiotics Warning signs and what to watch for were discussed I do not feel the patient has evidence of a pulmonary embolus If progressive troubles or ongoing troubles will need lab work and x-rays patient understands this

## 2018-03-03 ENCOUNTER — Encounter: Payer: Self-pay | Admitting: Family Medicine

## 2018-03-03 ENCOUNTER — Other Ambulatory Visit: Payer: Self-pay | Admitting: Family Medicine

## 2018-03-03 NOTE — Telephone Encounter (Signed)
Patient called today to report that his cough is unchanged and states you had asked him to report back regarding this. He say he is not running a temp, but states you wanted him to call back with a report of how he is doing.His daughter is not a pt here and says he was just telling you about her since you knew them both she is being treated by her pcp.What is the next step for him? He states no hurry and could wait until you return tomorrow.

## 2018-03-03 NOTE — Telephone Encounter (Signed)
Nurses Please connect with the patient Given his symptomatology I would recommend a round of antibiotics I recommend Biaxin 500 mg 1 twice daily for 10 days Take with a snack tall glass of water Hopefully this will help with treating for any secondary infection Hold off on x-ray If cough has not cleared up with a round of antibiotics then will do an x-ray Please communicate this to the patient

## 2018-03-04 ENCOUNTER — Other Ambulatory Visit: Payer: Self-pay | Admitting: Family Medicine

## 2018-03-04 MED ORDER — CLARITHROMYCIN 500 MG PO TABS: ORAL_TABLET | ORAL | 0 refills | Status: DC

## 2018-03-04 NOTE — Telephone Encounter (Signed)
Medication has been sent in to [facility]. Contacted patient and informed him of ABT and that he if does not seem to be getting better, he would need to be seen. Pt stated that this morning he coughed up yellow fluid which has not happened before. Spoke with provider and provider states that this is a good round of ABT but if patient felt like he needed to be seen, he has room in his schedule. Pt deferred being seen and would like to try ABT first. Pt verbalized understanding of all.

## 2018-03-18 ENCOUNTER — Encounter: Payer: Self-pay | Admitting: Family Medicine

## 2018-03-25 DIAGNOSIS — L570 Actinic keratosis: Secondary | ICD-10-CM | POA: Diagnosis not present

## 2018-03-25 DIAGNOSIS — L819 Disorder of pigmentation, unspecified: Secondary | ICD-10-CM | POA: Diagnosis not present

## 2018-03-25 DIAGNOSIS — L821 Other seborrheic keratosis: Secondary | ICD-10-CM | POA: Diagnosis not present

## 2018-03-25 DIAGNOSIS — D229 Melanocytic nevi, unspecified: Secondary | ICD-10-CM | POA: Diagnosis not present

## 2018-03-25 DIAGNOSIS — L814 Other melanin hyperpigmentation: Secondary | ICD-10-CM | POA: Diagnosis not present

## 2018-04-02 DIAGNOSIS — E039 Hypothyroidism, unspecified: Secondary | ICD-10-CM | POA: Diagnosis not present

## 2018-04-03 LAB — TSH: TSH: 6.78 u[IU]/mL — AB (ref 0.450–4.500)

## 2018-04-03 LAB — T3 UPTAKE: T3 Uptake Ratio: 27 % (ref 24–39)

## 2018-04-03 LAB — T4, FREE: Free T4: 1.05 ng/dL (ref 0.82–1.77)

## 2018-06-03 DIAGNOSIS — R69 Illness, unspecified: Secondary | ICD-10-CM | POA: Diagnosis not present

## 2018-08-06 ENCOUNTER — Encounter: Payer: Self-pay | Admitting: Family Medicine

## 2018-08-06 NOTE — Telephone Encounter (Signed)
Nurses-we are recommending virtual visits into mid May it is hard to predict outside of that  His wife is being treated for cancer I would recommend that we do a virtual visit for his medications in May We can do his lab work in the summer and a wellness exam later in the summer when some of the coronavirus settles to a more acceptable level

## 2018-08-23 ENCOUNTER — Other Ambulatory Visit: Payer: Self-pay | Admitting: Family Medicine

## 2018-08-26 ENCOUNTER — Telehealth: Payer: Self-pay | Admitting: Family Medicine

## 2018-08-26 MED ORDER — KETOPROFEN POWD: 5 refills | Status: DC

## 2018-08-26 NOTE — Telephone Encounter (Signed)
May refill this x6 Please notify patient

## 2018-08-26 NOTE — Telephone Encounter (Signed)
Prescription sent electronically to pharmacy. Patient notified. 

## 2018-08-26 NOTE — Telephone Encounter (Signed)
Duplicate - this was sent in today

## 2018-08-26 NOTE — Telephone Encounter (Signed)
Pt calling to check on refill for Ketoprofen POWD [Pharmacy Med Name: KETO/RIBOF/CAFF 12.5/100/65]  [place] sent a refill request and has not heard from [place] as of yet  Please advise & call pt when done

## 2018-08-29 ENCOUNTER — Ambulatory Visit: Payer: Medicare HMO | Admitting: Family Medicine

## 2018-09-02 ENCOUNTER — Other Ambulatory Visit: Payer: Self-pay | Admitting: Family Medicine

## 2018-09-05 ENCOUNTER — Encounter: Payer: Self-pay | Admitting: Family Medicine

## 2018-09-09 ENCOUNTER — Ambulatory Visit (INDEPENDENT_AMBULATORY_CARE_PROVIDER_SITE_OTHER): Payer: Medicare HMO | Admitting: Family Medicine

## 2018-09-09 ENCOUNTER — Other Ambulatory Visit: Payer: Self-pay

## 2018-09-09 DIAGNOSIS — R7989 Other specified abnormal findings of blood chemistry: Secondary | ICD-10-CM | POA: Diagnosis not present

## 2018-09-09 DIAGNOSIS — Z125 Encounter for screening for malignant neoplasm of prostate: Secondary | ICD-10-CM | POA: Diagnosis not present

## 2018-09-09 DIAGNOSIS — R51 Headache: Secondary | ICD-10-CM

## 2018-09-09 DIAGNOSIS — R519 Headache, unspecified: Secondary | ICD-10-CM

## 2018-09-09 DIAGNOSIS — E782 Mixed hyperlipidemia: Secondary | ICD-10-CM

## 2018-09-09 DIAGNOSIS — G8929 Other chronic pain: Secondary | ICD-10-CM

## 2018-09-09 MED ORDER — ATORVASTATIN CALCIUM 40 MG PO TABS: 40.0000 mg | ORAL_TABLET | Freq: Every day | ORAL | 1 refills | Status: DC

## 2018-09-09 NOTE — Progress Notes (Signed)
   Subjective:    Patient ID: Dylan Chapman, male    DOB: 11/22/1950, 68 y.o.   MRN: [id number] A/V today HPI Pt here for medication check. Pt states he has stopped Aspirin per provider. Pt is no longer taking Omeprazole or Ranitidine. Pt has script for Propanolol but has not had to use it due to cluster headaches come sparingly.   Pt would like to have blood work done. Pt uses [facility] for his headache compound and [facility] for all other med.    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  He is trying to eat healthy Virtual Visit via Video Note  I connected with Dylan Chapman on 09/09/18 at  9:30 AM EDT by a video enabled telemedicine application and verified that I am speaking with the correct person using two identifiers.  Location: Patient: home Provider: office   I discussed the limitations of evaluation and management by telemedicine and the availability of in person appointments. The patient expressed understanding and agreed to proceed.  History of Present Illness:    Observations/Objective:   Assessment and Plan:   Follow Up Instructions:    I discussed the assessment and treatment plan with the patient. The patient was provided an opportunity to ask questions and all were answered. The patient agreed with the plan and demonstrated an understanding of the instructions.   The patient was advised to call back or seek an in-person evaluation if the symptoms worsen or if the condition fails to improve as anticipated.  I provided 15 minutes of non-face-to-face time during this encounter.   Vicente Males, LPN    Review of Systems  Constitutional: Negative for activity change, fatigue and fever.  HENT: Negative for congestion and  rhinorrhea.   Respiratory: Negative for cough and shortness of breath.   Cardiovascular: Negative for chest pain and leg swelling.  Gastrointestinal: Negative for abdominal pain, diarrhea and nausea.  Genitourinary: Negative for dysuria and hematuria.  Neurological: Positive for headaches. Negative for weakness.  Psychiatric/Behavioral: Negative for agitation and behavioral problems.   15 minutes was spent with patient today discussing healthcare issues which they came.  More than 50% of this visit-total duration of visit-was spent in counseling and coordination of care.  Please see diagnosis regarding the focus of this coordination and care     Objective:   Physical Exam  Patient had virtual visit Appears to be in no distress Atraumatic Neuro able to relate and oriented No apparent resp distress Color normal       Assessment & Plan:  Subclinical hypothyroidism recheck TSH free T4 await the results  Hyperlipidemia continue medication previous labs reviewed new labs ordered  Reflux has resolved no longer having take any type of anti-reflux medicine  Headaches are stable continue current measures  History of elevated PSA recheck PSA  Wellness checkup in the summer

## 2018-10-31 ENCOUNTER — Other Ambulatory Visit: Payer: Self-pay | Admitting: Family Medicine

## 2018-10-31 DIAGNOSIS — R51 Headache: Secondary | ICD-10-CM | POA: Diagnosis not present

## 2018-10-31 DIAGNOSIS — Z125 Encounter for screening for malignant neoplasm of prostate: Secondary | ICD-10-CM | POA: Diagnosis not present

## 2018-10-31 DIAGNOSIS — E782 Mixed hyperlipidemia: Secondary | ICD-10-CM | POA: Diagnosis not present

## 2018-10-31 DIAGNOSIS — R7989 Other specified abnormal findings of blood chemistry: Secondary | ICD-10-CM | POA: Diagnosis not present

## 2018-11-01 LAB — HEPATIC FUNCTION PANEL
AG Ratio: 1.9 (calc) (ref 1.0–2.5)
ALT: 16 U/L (ref 9–46)
AST: 21 U/L (ref 10–35)
Albumin: 4.3 g/dL (ref 3.6–5.1)
Alkaline phosphatase (APISO): 73 U/L (ref 35–144)
Bilirubin, Direct: 0.1 mg/dL (ref 0.0–0.2)
Globulin: 2.3 g/dL (calc) (ref 1.9–3.7)
Indirect Bilirubin: 0.6 mg/dL (calc) (ref 0.2–1.2)
Total Bilirubin: 0.7 mg/dL (ref 0.2–1.2)
Total Protein: 6.6 g/dL (ref 6.1–8.1)

## 2018-11-01 LAB — T4, FREE: Free T4: 1.1 ng/dL (ref 0.8–1.8)

## 2018-11-01 LAB — LIPID PANEL
Cholesterol: 172 mg/dL (ref <200)
HDL: 58 mg/dL (ref >=40)
LDL Cholesterol (Calc): 95 mg/dL (calc)
Non-HDL Cholesterol (Calc): 114 mg/dL (calc) (ref <130)
Total CHOL/HDL Ratio: 3 (calc) (ref <5.0)
Triglycerides: 91 mg/dL (ref <150)

## 2018-11-01 LAB — BASIC METABOLIC PANEL WITH GFR
BUN/Creatinine Ratio: 24 (calc) — ABNORMAL HIGH (ref 6–22)
BUN: 27 mg/dL — ABNORMAL HIGH (ref 7–25)
CO2: 28 mmol/L (ref 20–32)
Calcium: 9.9 mg/dL (ref 8.6–10.3)
Chloride: 106 mmol/L (ref 98–110)
Creat: 1.13 mg/dL (ref 0.70–1.25)
GFR, Est African American: 78 mL/min/{1.73_m2} (ref >=60)
GFR, Est Non African American: 67 mL/min/{1.73_m2} (ref >=60)
Glucose, Bld: 83 mg/dL (ref 65–99)
Potassium: 4.5 mmol/L (ref 3.5–5.3)
Sodium: 143 mmol/L (ref 135–146)

## 2018-11-01 LAB — TSH: TSH: 4.63 mIU/L — ABNORMAL HIGH (ref 0.40–4.50)

## 2018-11-01 LAB — PSA: PSA: 1.7 ng/mL (ref <=4.0)

## 2018-11-18 ENCOUNTER — Other Ambulatory Visit: Payer: Self-pay

## 2018-11-18 ENCOUNTER — Ambulatory Visit (INDEPENDENT_AMBULATORY_CARE_PROVIDER_SITE_OTHER): Payer: Medicare HMO | Admitting: Family Medicine

## 2018-11-18 ENCOUNTER — Encounter: Payer: Self-pay | Admitting: Family Medicine

## 2018-11-18 VITALS — BP 124/82 | Temp 97.7°F | Ht 66.5 in | Wt 168.2 lb

## 2018-11-18 DIAGNOSIS — Z0000 Encounter for general adult medical examination without abnormal findings: Secondary | ICD-10-CM | POA: Diagnosis not present

## 2018-11-18 DIAGNOSIS — I77811 Abdominal aortic ectasia: Secondary | ICD-10-CM | POA: Insufficient documentation

## 2018-11-18 HISTORY — DX: Abdominal aortic ectasia: I77.811

## 2018-11-18 NOTE — Progress Notes (Signed)
Subjective:    Patient ID: Dylan Chapman, male    DOB: [birth year], 68 y.o.   MRN: [id number]  HPI The patient comes in today for a wellness visit. Annual wellness visit Medicare Cognitive exam is normal No recent falls Assessment forms reviewed overall doing well continue current measures Negative for depression   A review of their health history was completed.  A review of medications was also completed.  Any needed refills; atorvastatin   Eating habits: eats well  Falls/  MVA accidents in past few months: none  Regular exercise: 4 days a week  Specialist pt sees on regular basis: none  Preventative health issues were discussed.   Additional concerns: none   Review of Systems  Constitutional: Negative for activity change, appetite change and fever.  HENT: Negative for congestion and rhinorrhea.   Eyes: Negative for discharge.  Respiratory: Negative for cough and wheezing.   Cardiovascular: Negative for chest pain.  Gastrointestinal: Negative for abdominal pain, blood in stool and vomiting.  Genitourinary: Negative for difficulty urinating and frequency.  Musculoskeletal: Negative for neck pain.  Skin: Negative for rash.  Allergic/Immunologic: Negative for environmental allergies and food allergies.  Neurological: Negative for weakness and headaches.  Psychiatric/Behavioral: Negative for agitation.       Objective:   Physical Exam Constitutional:      Appearance: He is well-developed.  HENT:     Head: Normocephalic and atraumatic.     Right Ear: External ear normal.     Left Ear: External ear normal.     Nose: Nose normal.  Eyes:     Pupils: Pupils are equal, round, and reactive to light.  Neck:     Musculoskeletal: Normal range of motion and neck supple.     Thyroid: No thyromegaly.  Cardiovascular:     Rate and Rhythm: Normal rate and regular rhythm.     Heart sounds: Normal heart sounds. No murmur.  Pulmonary:     Effort: Pulmonary effort is  normal. No respiratory distress.     Breath sounds: Normal breath sounds. No wheezing.  Abdominal:     General: Bowel sounds are normal. There is no distension.     Palpations: Abdomen is soft. There is no mass.     Tenderness: There is no abdominal tenderness.  Genitourinary:    Penis: Normal.      Prostate: Normal.  Musculoskeletal: Normal range of motion.  Lymphadenopathy:     Cervical: No cervical adenopathy.  Skin:    General: Skin is warm and dry.     Findings: No erythema.  Neurological:     Mental Status: He is alert.     Motor: No abnormal muscle tone.  Psychiatric:        Behavior: Behavior normal.        Judgment: Judgment normal.     Patient has already had shin Grix vaccine PSA looks good Has subclinical hypothyroidism I do not recommend medication       Assessment & Plan:  Adult wellness-complete.wellness physical was conducted today. Importance of diet and exercise were discussed in detail.  In addition to this a discussion regarding safety was also covered. We also reviewed over immunizations and gave recommendations regarding current immunization needed for age.  In addition to this additional areas were also touched on including: Preventative health exams needed:  Colonoscopy 2023  Patient was advised yearly wellness exam Hyperlipidemia doing well with medication Subclinical hypothyroidism hold off on medication Recheck labs by June 2021 Headaches  are stable on current medication Weight was discussed with patient the importance of trying to lose some weight he will try to lose 10 pounds  Follow-up abdominal ultrasound in 2023 to look at aorta

## 2018-11-18 NOTE — Patient Instructions (Signed)
Thank you for coming for your annual wellness visit.  Please follow through on any advice that was given to you by today's visit. Remember to maintain compliance with your medications as discussed today.  Also remember it is important to eat a healthy diet and to stay physically active on a daily basis.  Please follow through with any testing or recommended followup office visits as was discussed today. You are due the following test coming up:  ALL- Colonoscopy-2023            Vaccines-flu vaccine this fall         Finally remembered that the annual wellness visit does not take the place of regularly scheduled office visits  chronic health problems such as hypertension/diabetes/cholesterol visits.

## 2018-11-26 ENCOUNTER — Other Ambulatory Visit: Payer: Self-pay | Admitting: Family Medicine

## 2018-11-28 ENCOUNTER — Encounter: Payer: Self-pay | Admitting: Family Medicine

## 2018-12-02 ENCOUNTER — Ambulatory Visit (HOSPITAL_COMMUNITY)
Admission: RE | Admit: 2018-12-02 | Discharge: 2018-12-02 | Disposition: A | Discharge status: Home / Self Care | Payer: Medicare HMO | Source: Ambulatory Visit | Attending: Family Medicine | Admitting: Family Medicine

## 2018-12-02 ENCOUNTER — Encounter: Payer: Self-pay | Admitting: Family Medicine

## 2018-12-02 ENCOUNTER — Other Ambulatory Visit: Payer: Self-pay

## 2018-12-02 ENCOUNTER — Ambulatory Visit (INDEPENDENT_AMBULATORY_CARE_PROVIDER_SITE_OTHER): Payer: Medicare HMO | Admitting: Family Medicine

## 2018-12-02 VITALS — BP 138/76 | Temp 97.7°F | Wt 165.4 lb

## 2018-12-02 DIAGNOSIS — M25511 Pain in right shoulder: Secondary | ICD-10-CM | POA: Diagnosis not present

## 2018-12-02 MED ORDER — NAPROXEN 500 MG PO TABS: 500.0000 mg | ORAL_TABLET | Freq: Two times a day (BID) | ORAL | 1 refills | Status: DC

## 2018-12-02 NOTE — Progress Notes (Signed)
   Subjective:    Patient ID: Dylan Chapman, male    DOB: 1951/03/20, 68 y.o.   MRN: [id number]  Shoulder Pain  The pain is present in the right shoulder. This is a new problem. The current episode started more than 1 month ago. History of extremity trauma: pt had surgery on arm in the 1980s and has a 3 inch screw in shoulder. Quality: when pt lifts arm outward it is sharp, stabbing pain. when he is awaken at night by the pain it is an ache. Associated symptoms include a limited range of motion. Pertinent negatives include no fever. Associated symptoms comments: Pain is waking him up at night. He has tried NSAIDS (hydrocodone) for the symptoms. The treatment provided no relief.  Patient relates significant pain hurts with certain movements been going on for several weeks now wakes him up at nighttime.  Denies any injury currently but had previous injury.  Has not tried anything other than OTC anti-inflammatories without much success PMH benign    Review of Systems  Constitutional: Negative for activity change, fatigue and fever.  HENT: Negative for congestion and rhinorrhea.   Respiratory: Negative for cough and shortness of breath.   Cardiovascular: Negative for chest pain and leg swelling.  Gastrointestinal: Negative for abdominal pain, diarrhea and nausea.  Genitourinary: Negative for dysuria and hematuria.  Neurological: Negative for weakness and headaches.  Psychiatric/Behavioral: Negative for agitation and behavioral problems.       Objective:   Physical Exam Vitals signs reviewed.  Cardiovascular:     Rate and Rhythm: Normal rate and regular rhythm.     Heart sounds: Normal heart sounds. No murmur.  Pulmonary:     Effort: Pulmonary effort is normal.     Breath sounds: Normal breath sounds.  Lymphadenopathy:     Cervical: No cervical adenopathy.  Neurological:     Mental Status: He is alert.  Psychiatric:        Behavior: Behavior normal.    Full shoulder evaluation was  conducted with multiple actions and movements.  Shows significant lateral aspect with pain and discomfort with raising the arm off to the right side.  No detection of rotator cuff weakness       Assessment & Plan:  Shoulder pain Naprosyn twice daily Cold compresses Gentle range of motion Exercise pamphlet given If not having significant improvement over the course the next 2 weeks next step would be referral to physical therapy and orthopedics X-rays were ordered await the results

## 2018-12-14 ENCOUNTER — Encounter: Payer: Self-pay | Admitting: Family Medicine

## 2018-12-15 ENCOUNTER — Other Ambulatory Visit: Payer: Self-pay | Admitting: Family Medicine

## 2018-12-15 MED ORDER — HYDROCODONE-ACETAMINOPHEN 5-325 MG PO TABS: 1.0000 | ORAL_TABLET | Freq: Four times a day (QID) | ORAL | 0 refills | Status: AC | PRN

## 2018-12-16 DIAGNOSIS — R69 Illness, unspecified: Secondary | ICD-10-CM | POA: Diagnosis not present

## 2018-12-25 ENCOUNTER — Encounter: Payer: Self-pay | Admitting: Family Medicine

## 2019-01-23 DIAGNOSIS — R69 Illness, unspecified: Secondary | ICD-10-CM | POA: Diagnosis not present

## 2019-04-04 ENCOUNTER — Encounter: Payer: Self-pay | Admitting: Family Medicine

## 2019-04-06 ENCOUNTER — Other Ambulatory Visit: Payer: Self-pay | Admitting: Family Medicine

## 2019-05-03 ENCOUNTER — Encounter: Payer: Self-pay | Admitting: Family Medicine

## 2019-05-24 ENCOUNTER — Emergency Department (HOSPITAL_COMMUNITY): Payer: Medicare HMO

## 2019-05-24 ENCOUNTER — Other Ambulatory Visit: Payer: Self-pay

## 2019-05-24 ENCOUNTER — Encounter (HOSPITAL_COMMUNITY): Payer: Self-pay

## 2019-05-24 ENCOUNTER — Emergency Department (HOSPITAL_COMMUNITY)
Admission: EM | Admit: 2019-05-24 | Discharge: 2019-05-24 | Disposition: A | Discharge status: Home / Self Care | Payer: Medicare HMO | Attending: Emergency Medicine | Admitting: Emergency Medicine

## 2019-05-24 DIAGNOSIS — W010XXA Fall on same level from slipping, tripping and stumbling without subsequent striking against object, initial encounter: Secondary | ICD-10-CM | POA: Diagnosis not present

## 2019-05-24 DIAGNOSIS — Z87891 Personal history of nicotine dependence: Secondary | ICD-10-CM | POA: Insufficient documentation

## 2019-05-24 DIAGNOSIS — Z79899 Other long term (current) drug therapy: Secondary | ICD-10-CM | POA: Insufficient documentation

## 2019-05-24 DIAGNOSIS — Y92007 Garden or yard of unspecified non-institutional (private) residence as the place of occurrence of the external cause: Secondary | ICD-10-CM | POA: Insufficient documentation

## 2019-05-24 DIAGNOSIS — Y999 Unspecified external cause status: Secondary | ICD-10-CM | POA: Insufficient documentation

## 2019-05-24 DIAGNOSIS — S52502A Unspecified fracture of the lower end of left radius, initial encounter for closed fracture: Secondary | ICD-10-CM | POA: Insufficient documentation

## 2019-05-24 DIAGNOSIS — Y9301 Activity, walking, marching and hiking: Secondary | ICD-10-CM | POA: Insufficient documentation

## 2019-05-24 DIAGNOSIS — S52572A Other intraarticular fracture of lower end of left radius, initial encounter for closed fracture: Secondary | ICD-10-CM | POA: Diagnosis not present

## 2019-05-24 DIAGNOSIS — S6992XA Unspecified injury of left wrist, hand and finger(s), initial encounter: Secondary | ICD-10-CM | POA: Diagnosis present

## 2019-05-24 MED ORDER — OXYCODONE-ACETAMINOPHEN 5-325 MG PO TABS
1.0000 | ORAL_TABLET | Freq: Once | ORAL | Status: AC
  Administered 2019-05-24: 1 via ORAL

## 2019-05-24 NOTE — ED Provider Notes (Signed)
[facility] EMERGENCY DEPARTMENT Provider Note   CSN: [id number] Arrival date & time: 05/24/19  [id number]     History Chief Complaint  Patient presents with  . Fall    left wrist    Dylan Chapman is a 69 y.o. male.  HPI     This is a 69 year old male with a history of hyperlipidemia, migraines who presents with left wrist pain.  Patient reports that he went out to get the newspaper this morning when he slipped on the ice and fell onto an outstretched hand.  He is complaining mostly of left wrist pain.  Rates his pain 8 out of 10.  He has not taken anything for the pain.  Denies numbness or tingling.  Denies hitting his head or loss of consciousness.  He is right-handed.  Denies other injury.  He did not take anything for pain prior to arrival.  He has been ambulatory.  Past Medical History:  Diagnosis Date  . 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 GERD with esophagitis   . Hyperlipidemia   . Migraines     Patient Active Problem List   Diagnosis Date Noted  . 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 Tubular adenoma 01/09/2017  . Elevated PSA 08/30/2016  . Hyperlipidemia 08/18/2015  . Chronic headaches 08/18/2015    Past Surgical History:  Procedure Laterality Date  . BACK SURGERY    . COLONOSCOPY  2007   Dr. Gala Romney: normal rectum and scattered sigmoid diverticulum   . COLONOSCOPY N/A 10/09/2016   Dr. Gala Romney: non-bleeding internal hemorrhoids, diverticulosis in sigmoid and descending colon, one 4 mm tubular adenoma at recto-sigmoid s/p cold snare removal. Surveillance in 7 years  . ESOPHAGOGASTRODUODENOSCOPY N/A 10/09/2016   Dr. Gala Romney: LA Grade A esophagitis, normal stomach, normal ampulla and second portion of duodenum, no specimens collected  . HERNIA REPAIR Left    left inguinal  . MALONEY DILATION N/A 10/09/2016   Procedure: Venia Minks DILATION;  Surgeon: Daneil Dolin, MD;   Location: [facility] ENDO SUITE;  Service: Endoscopy;  Laterality: N/A;  . SHOULDER SURGERY         Family History  Problem Relation Age of Onset  . Heart disease Father   . Prostate cancer Father   . Colon cancer Neg Hx   . Colon polyps Neg Hx     Social History   Tobacco Use  . Smoking status: Former Research scientist (life sciences)  . Smokeless tobacco: Never Used  Substance Use Topics  . Alcohol use: Yes    Alcohol/week: 3.0 standard drinks    Types: 3 Glasses of wine per week    Comment: 3 glasses wine/week; occ beer  . Drug use: No    Home Medications Prior to Admission medications   Medication Sig Start Date End Date Taking? Authorizing Provider  atorvastatin (LIPITOR) 40 MG tablet Take 1 tablet by mouth once daily 11/26/18   Kathyrn Drown, MD  Ketoprofen POWD AS DIRECTED. 08/26/18   Kathyrn Drown, MD  Multiple Vitamin (MULTIVITAMIN WITH MINERALS) TABS tablet Take 1 tablet by mouth daily.    [provider]  naproxen (NAPROSYN) 500 MG tablet Take 1 tablet (500 mg total) by mouth 2 (two) times daily with a meal. 12/02/18   Luking, Elayne Snare, MD  PRESCRIPTION MEDICATION Compound for headaches    [provider]  propranolol (INDERAL) 80 MG tablet Take one tab 3 times  daily as needed for cluster headaches 09/06/17   Kathyrn Drown, MD    Allergies    Penicillins  Review of Systems   Review of Systems  Musculoskeletal:       Left wrist pain  Neurological: Negative for syncope, weakness and numbness.  All other systems reviewed and are negative.   Physical Exam Updated Vital Signs Temp 98.8 F (37.1 C) (Oral)   Ht 1.702 m (5\' 7" )   Wt 74.8 kg   BMI 25.84 kg/m   Physical Exam Vitals and nursing note reviewed.  Constitutional:      Appearance: He is well-developed.     Comments: ABCs intact  HENT:     Head: Normocephalic and atraumatic.     Mouth/Throat:     Mouth: Mucous membranes are moist.  Eyes:     Pupils: Pupils are equal, round, and reactive to light.    Cardiovascular:     Rate and Rhythm: Normal rate and regular rhythm.  Pulmonary:     Effort: Pulmonary effort is normal. No respiratory distress.  Musculoskeletal:     Comments: Slight swelling noted over the left wrist with tenderness to palpation, no significant deformity noted, 2+ radial pulse, neurovascular intact distally, no overlying skin changes   Skin:    General: Skin is warm and dry.  Neurological:     Mental Status: He is alert and oriented to person, place, and time.  Psychiatric:        Mood and Affect: Mood normal.     ED Results / Procedures / Treatments   Labs (all labs ordered are listed, but only abnormal results are displayed) Labs Reviewed - No data to display  EKG None  Radiology No results found.  Procedures Procedures (including critical care time)  Medications Ordered in ED Medications  oxyCODONE-acetaminophen (PERCOCET/ROXICET) 5-325 MG per tablet 1 tablet (has no administration in time range)    ED Course  I have reviewed the triage vital signs and the nursing notes.  Pertinent labs & imaging results that were available during my care of the patient were reviewed by me and considered in my medical decision making (see chart for details).    MDM Rules/Calculators/A&P                      Patient presents with left wrist pain.  ABCs intact.  Vital signs are reassuring.  He reports isolated injury.  He is neurovascularly intact.  X-rays obtained and patient given Percocet.  Patient signed out to oncoming provider pending x-rays.   Final Clinical Impression(s) / ED Diagnoses Final diagnoses:  None    Rx / DC Orders ED Discharge Orders    None       Tacy Chavis, Barbette Hair, MD 05/24/19 [phone]

## 2019-05-24 NOTE — ED Triage Notes (Signed)
Pt slipped and fell this am landing on left wrist. Swelling noted, pt able to move fingers. No LOC. Pt did not hit head.

## 2019-05-24 NOTE — ED Provider Notes (Signed)
Care transferred to me.  Patient has fracture of his left radius.  No significant displacement that would warrant reduction.  Neurovascular intact on my exam.  He reports he has hydrocodone at home for pain.  He will be referred to orthopedic surgery and call office in the morning.  Sugar tong splint.  Today's Vitals   05/24/19 0652 05/24/19 0653 05/24/19 0808  BP:   (!) 143/89  Pulse:   76  Resp:   18  Temp: 98.8 F (37.1 C)    TempSrc: Oral    SpO2:   100%  Weight:  74.8 kg   Height:  5\' 7"  (1.702 m)   PainSc: 8      Body mass index is 25.84 kg/m. DG Wrist Complete Left  Result Date: 05/24/2019 CLINICAL DATA:  Initial evaluation for acute trauma, fall, wrist pain. EXAM: LEFT WRIST - COMPLETE 3+ VIEW COMPARISON:  None. FINDINGS: There is an acute comminuted fracture of the distal left radius with associated intra-articular extension and mild displacement. Ulna intact. No other acute osseous abnormality about the wrist. Diffuse soft tissue swelling noted. IMPRESSION: Acute comminuted intra-articular fracture of the distal left radius with mild displacement. Electronically Signed   By: Jeannine Boga M.D.   On: 05/24/2019 07:41       Sherwood Gambler, MD 05/24/19 [phone]

## 2019-05-25 ENCOUNTER — Ambulatory Visit: Payer: Medicare HMO | Admitting: Orthopedic Surgery

## 2019-05-25 ENCOUNTER — Encounter: Payer: Self-pay | Admitting: Orthopedic Surgery

## 2019-05-25 ENCOUNTER — Other Ambulatory Visit: Payer: Self-pay | Admitting: Radiology

## 2019-05-25 VITALS — BP 130/81 | HR 69 | Ht 67.0 in | Wt 170.0 lb

## 2019-05-25 DIAGNOSIS — W19XXXA Unspecified fall, initial encounter: Secondary | ICD-10-CM

## 2019-05-25 DIAGNOSIS — S52532A Colles' fracture of left radius, initial encounter for closed fracture: Secondary | ICD-10-CM | POA: Diagnosis not present

## 2019-05-25 MED ORDER — PROMETHAZINE HCL 12.5 MG PO TABS: 12.5000 mg | ORAL_TABLET | Freq: Four times a day (QID) | ORAL | 0 refills | Status: DC | PRN

## 2019-05-25 MED ORDER — IBUPROFEN 800 MG PO TABS: 800.0000 mg | ORAL_TABLET | Freq: Three times a day (TID) | ORAL | 1 refills | Status: DC | PRN

## 2019-05-25 MED ORDER — HYDROCODONE-ACETAMINOPHEN 5-325 MG PO TABS: 1.0000 | ORAL_TABLET | Freq: Four times a day (QID) | ORAL | 0 refills | Status: AC | PRN

## 2019-05-25 NOTE — Progress Notes (Signed)
EMERGENCY ROOM FOLLOW UP  NEW PROBLEM/PATIENT   Patient ID: Dylan Chapman, male   DOB: [birth year], 69 y.o.   MRN: [id number]  Emergency room record from (date) 05/24/19 has been reviewed and this is included by reference and includes the review of systems with the following addition:   Chief Complaint  Patient presents with  . Wrist Injury    05/24/19 fall left wrist fracture     HPI Dylan Chapman is a 69 y.o. male.  Presents for evaluation following a left distal radius fracture sustained after falling on his outstretched hand on May 24, 2019.    He was seen in the emergency room evaluated and found to have a fracture placed in a splint.  He complained of moderate pain which was treated.  Denies any numbness or tingling.    Review of Systems Review of Systems  HENT: Positive for nosebleeds.   Respiratory: Negative for shortness of breath.   Cardiovascular: Negative for chest pain.  Skin: Negative.   Neurological: Negative for numbness.     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   Past Surgical History:  Procedure Laterality Date  . BACK SURGERY    . COLONOSCOPY  2007   Dr. Gala Romney: normal rectum and scattered sigmoid diverticulum   . COLONOSCOPY N/A 10/09/2016   Dr. Gala Romney: non-bleeding internal hemorrhoids, diverticulosis in sigmoid and descending colon, one 4 mm tubular adenoma at recto-sigmoid s/p cold snare removal. Surveillance in 7 years  . ESOPHAGOGASTRODUODENOSCOPY N/A 10/09/2016   Dr. Gala Romney: LA Grade A esophagitis, normal stomach, normal ampulla and second portion of duodenum, no specimens collected  . HERNIA REPAIR Left    left inguinal  . LAMINECTOMY    . MALONEY DILATION N/A 10/09/2016   Procedure: Venia Minks DILATION;  Surgeon: Daneil Dolin, MD;  Location: [facility];  Service: Endoscopy;  Laterality: N/A;  . SHOULDER SURGERY      Family History  Problem Relation Age of  Onset  . Heart disease Father   . Prostate cancer Father   . Colon cancer Neg Hx   . Colon polyps Neg Hx     Social History Social History   Tobacco Use  . Smoking status: Former Research scientist (life sciences)  . Smokeless tobacco: Never Used  Substance Use Topics  . Alcohol use: Yes    Alcohol/week: 3.0 standard drinks    Types: 3 Glasses of wine per week    Comment: 3 glasses wine/week; occ beer  . Drug use: No    Allergies  Allergen Reactions  . Penicillins Hives    Has patient had a PCN reaction causing immediate rash, facial/tongue/throat swelling, SOB or lightheadedness with hypotension: No Has patient had a PCN reaction causing severe rash involving mucus membranes or skin necrosis: No Has patient had a PCN reaction that required hospitalization: No Has patient had a PCN reaction occurring within the last 10 years: No If all of the above answers are "NO", then may proceed with Cephalosporin use.      Current Outpatient Medications  Medication Sig Dispense Refill  . atorvastatin (LIPITOR) 40 MG tablet Take 1 tablet by mouth once daily 90 tablet 0  . Ketoprofen POWD AS DIRECTED. 60 g 5  . Multiple Vitamin (MULTIVITAMIN WITH MINERALS) TABS tablet Take 1 tablet by mouth daily.    . naproxen (NAPROSYN) 500 MG tablet Take 1 tablet (500 mg total) by mouth 2 (two) times daily with a  meal. 40 tablet 1  . PRESCRIPTION MEDICATION Compound for headaches    . propranolol (INDERAL) 80 MG tablet Take one tab 3 times daily as needed for cluster headaches 30 tablet 6  . ibuprofen (ADVIL) 800 MG tablet Take 1 tablet (800 mg total) by mouth every 8 (eight) hours as needed. 90 tablet 1  . promethazine (PHENERGAN) 12.5 MG tablet Take 1 tablet (12.5 mg total) by mouth every 6 (six) hours as needed for nausea or vomiting. 30 tablet 0   No current facility-administered medications for this visit.    Physical Exam BP 130/81   Pulse 69   Ht 5\' 7"  (1.702 m)   Wt 170 lb (77.1 kg)   BMI 26.63 kg/m  Body mass  index is 26.63 kg/m.  Well developed and well nourished  Stands with normal weight bearing line  Alert and oriented x 3  Normal affect and mood  Ortho Exam Right upper extremity neurovascularly intact skin normal no tenderness normal range of motion wrist is stable muscle tone is normal  Left upper extremity neurovascular intact skin is normal there is tenderness over the distal radius and decreased range of motion secondary to pain x-ray was used to confirm stability of the wrist muscle tone was normal  Data Reviewed IMAGING From THE ER AND THE REPORT ARE REVIEWED, MY INTERPRETATION OF THE IMAGE(S) IS : Minimally displaced extra-articular fracture of the left distal radius with involvement of the radial ulnar joint but not radiocarpal joint  ER records are reviewed and indicate the patient has hyperlipidemia migraines but came into the hospital for left wrist pain after slipping on the ice  He was treated with a splint  Assessment  Closed fracture left distal radius minimal angulation and minimal displacement  Plan   Short arm cast, 4 weeks and then brace 4 weeks, x-ray next visit   Arther Abbott, MD 05/25/2019 10:11 AM  Meds ordered this encounter  Medications  . promethazine (PHENERGAN) 12.5 MG tablet    Sig: Take 1 tablet (12.5 mg total) by mouth every 6 (six) hours as needed for nausea or vomiting.    Dispense:  30 tablet    Refill:  0  . ibuprofen (ADVIL) 800 MG tablet    Sig: Take 1 tablet (800 mg total) by mouth every 8 (eight) hours as needed.    Dispense:  90 tablet    Refill:  1

## 2019-05-25 NOTE — Telephone Encounter (Signed)
Patient when leaving asked for RX for Hydrocodone, had some left over previous injury in fall, but very low, please send.

## 2019-05-25 NOTE — Patient Instructions (Signed)
Colles Fracture  Colles fracture is a type of broken wrist. It means that the radius bone is broken or cracked near the wrist joint. The radius is one of two bones in the forearm. It is on the same side as the thumb. The other forearm bone is called the ulna. Often, when someone has a Colles fracture, the ulna is also broken. As this injury heals, a splint or a cast is used to prevent the injured bone from moving (keep it immobilized). What are the causes? Common causes of this type of fracture include:  A hard, direct hit to the wrist.  Accidents, such as a car accident or falling on an outstretched hand. What increases the risk? You may be at higher risk for this type of fracture if you:  Play contact sports or high-risk sports, such as skiing, biking, or ice-skating.  Smoke.  Drink more than three alcoholic beverages a day.  Have low or lowered bone density (osteoporosis or osteopenia).  Are a young child or an older adult.  Are a woman who has gone through menopause.  Have a history of bone fractures.  Are not getting enough (have a deficiency in) calcium or vitamin D. What are the signs or symptoms? Symptoms of a Colles fracture may include:  Tenderness, bruising, and swelling over the fracture, which is usually near the wrist.  The wrist hanging in an odd position or looking misshapen (deformed).  Difficulty moving the wrist. How is this diagnosed? This condition may be diagnosed based on:  A physical exam.  A forearm X-ray.  Your symptoms and medical history. How is this treated? Treatment depends on many factors, including your age, your activity level, and how severe your fracture is. Treatment may include:  Immobilizing the wrist with a splint or a cast for several weeks. Before a splint or cast is placed on the arm, the health care provider may move the fractured bone or bones back into place (realignment).  Surgery, if the bone is completely out of place  (displaced). Metal pins or other devices may be used to help hold the bone in place while it heals. After surgery, the arm is put in a splint or cast.  Physical therapy. Follow these instructions at home: If you have a splint:  Wear the splint as told by your health care provider. Remove it only as told by your health care provider.  Loosen the splint if your fingers tingle, become numb, or turn cold and blue.  Keep the splint clean.  If your splint is not waterproof: ? Do not let it get wet. ? Cover it with a watertight covering when you take a bath or a shower. If you have a cast:  Do not stick anything inside the cast to scratch your skin. Doing that increases your risk for infection.  Check the skin around the cast every day. Tell your health care provider about any concerns.  You may put lotion on dry skin around the edges of the cast. Do not put lotion on the skin underneath the cast.  Keep the cast clean.  If the cast is not waterproof: ? Do not let it get wet. ? Cover it with a watertight covering when you take a bath or a shower. Managing pain, stiffness, and swelling   If directed, put ice on the injured area: ? If you have a removable splint, remove it as told by your health care provider. ? Put ice in a plastic bag. ?  Place a towel between your skin and the bag, or between your cast and the bag. ? Leave the ice on for 20 minutes, 2-3 times a day.  Move your fingers often to avoid stiffness and to lessen swelling.  Raise (elevate) your wrist above the level of your heart while you are sitting or lying down. Driving  Do not drive or use heavy machinery while taking prescription pain medicine.  Ask your health care provider if it is safe for you to drive if you have a splint or cast on your arm. Activity  Do not lift anything that is heavier than 10 lb (4.5 kg), or the limit that you are told, until your health care provider says that it is safe.  Do not use  your arm to support your body weight until your health care provider says that you can.  Return to your normal activities as told by your health care provider. Ask your health care provider what activities are safe for you.  If physical therapy was prescribed, do exercises as told by your health care provider. General instructions  Do not put pressure on any part of the splint or cast until it is fully hardened. This may take several hours.  Do not use any products that contain nicotine or tobacco, such as cigarettes and e-cigarettes. These can delay bone healing. If you need help quitting, ask your health care provider.  Take over-the-counter and prescription medicines only as told by your health care provider.  Keep all follow-up visits as told by your health care provider. This is important. Contact a health care provider if:  Your splint or cast: ? Gets wet. ? Gets damaged. ? Suddenly feels too tight.  You have: ? A fever or chills. ? Pain that does not get better with medicine. ? Swelling that gets worse. Get help right away if:  Your hand or fingernails: ? Turn blue or gray. ? Feel cold or numb.  You have tingling or numbness in your fingers, even after you loosen your splint (if this applies). Summary  Colles fracture is a type of broken wrist. It often involves both of the bones in the forearm (radius and ulna).  Fractures are common in young, active people who are involved in high-energy activities. They are also common in older people who are at risk for osteoporosis.  This injury is diagnosed with a physical exam and X-rays.  Your wrist will need to be held in place (immobilized) with a splint or cast for several weeks. You may need surgery for a more severe fracture. This information is not intended to replace advice given to you by your health care provider. Make sure you discuss any questions you have with your health care provider. Document Revised: 03/22/2017  Document Reviewed: 03/08/2017 Elsevier Patient Education  2020 [employer].

## 2019-05-28 ENCOUNTER — Other Ambulatory Visit: Payer: Self-pay | Admitting: Orthopedic Surgery

## 2019-05-28 ENCOUNTER — Ambulatory Visit
Admission: RE | Admit: 2019-05-28 | Discharge: 2019-05-28 | Disposition: A | Discharge status: Home / Self Care | Payer: Medicare HMO | Source: Ambulatory Visit | Attending: Orthopedic Surgery | Admitting: Orthopedic Surgery

## 2019-05-28 ENCOUNTER — Encounter: Payer: Self-pay | Admitting: Family Medicine

## 2019-05-28 ENCOUNTER — Other Ambulatory Visit: Payer: Self-pay

## 2019-05-28 DIAGNOSIS — S52572A Other intraarticular fracture of lower end of left radius, initial encounter for closed fracture: Secondary | ICD-10-CM | POA: Diagnosis not present

## 2019-05-28 DIAGNOSIS — S52502A Unspecified fracture of the lower end of left radius, initial encounter for closed fracture: Secondary | ICD-10-CM

## 2019-06-03 ENCOUNTER — Ambulatory Visit: Payer: Medicare HMO

## 2019-06-04 DIAGNOSIS — S52562D Barton's fracture of left radius, subsequent encounter for closed fracture with routine healing: Secondary | ICD-10-CM | POA: Diagnosis not present

## 2019-06-12 DIAGNOSIS — S52562D Barton's fracture of left radius, subsequent encounter for closed fracture with routine healing: Secondary | ICD-10-CM | POA: Diagnosis not present

## 2019-06-22 ENCOUNTER — Ambulatory Visit: Payer: Medicare HMO | Admitting: Orthopedic Surgery

## 2019-06-25 DIAGNOSIS — S52502A Unspecified fracture of the lower end of left radius, initial encounter for closed fracture: Secondary | ICD-10-CM | POA: Diagnosis not present

## 2019-07-21 DIAGNOSIS — S52502A Unspecified fracture of the lower end of left radius, initial encounter for closed fracture: Secondary | ICD-10-CM | POA: Diagnosis not present

## 2019-08-05 DIAGNOSIS — Z87891 Personal history of nicotine dependence: Secondary | ICD-10-CM | POA: Diagnosis not present

## 2019-08-05 DIAGNOSIS — Z880 Allergy status to penicillin: Secondary | ICD-10-CM | POA: Diagnosis not present

## 2019-08-05 DIAGNOSIS — E785 Hyperlipidemia, unspecified: Secondary | ICD-10-CM | POA: Diagnosis not present

## 2019-08-05 DIAGNOSIS — G43909 Migraine, unspecified, not intractable, without status migrainosus: Secondary | ICD-10-CM | POA: Diagnosis not present

## 2019-08-05 DIAGNOSIS — Z8249 Family history of ischemic heart disease and other diseases of the circulatory system: Secondary | ICD-10-CM | POA: Diagnosis not present

## 2019-08-05 DIAGNOSIS — Z809 Family history of malignant neoplasm, unspecified: Secondary | ICD-10-CM | POA: Diagnosis not present

## 2019-08-18 DIAGNOSIS — S52502A Unspecified fracture of the lower end of left radius, initial encounter for closed fracture: Secondary | ICD-10-CM | POA: Diagnosis not present

## 2019-08-18 DIAGNOSIS — G5602 Carpal tunnel syndrome, left upper limb: Secondary | ICD-10-CM | POA: Diagnosis not present

## 2019-08-31 DIAGNOSIS — G5602 Carpal tunnel syndrome, left upper limb: Secondary | ICD-10-CM | POA: Diagnosis not present

## 2019-09-07 ENCOUNTER — Other Ambulatory Visit: Payer: Self-pay | Admitting: Family Medicine

## 2019-09-08 DIAGNOSIS — G5602 Carpal tunnel syndrome, left upper limb: Secondary | ICD-10-CM | POA: Diagnosis not present

## 2019-09-08 DIAGNOSIS — S52502A Unspecified fracture of the lower end of left radius, initial encounter for closed fracture: Secondary | ICD-10-CM | POA: Diagnosis not present

## 2019-09-14 ENCOUNTER — Other Ambulatory Visit: Payer: Self-pay | Admitting: Family Medicine

## 2019-09-14 NOTE — Telephone Encounter (Signed)
3 refills 

## 2019-09-16 ENCOUNTER — Telehealth: Payer: Self-pay | Admitting: *Deleted

## 2019-09-16 NOTE — Telephone Encounter (Signed)
Fax from [facility] requesting refill on compounded med. keto/ribof/caff 12.5/100/nmm/ms $60 cap take as directed. lst dispensed 07/02/19. Pt last seen for shoulder pain on 12/02/18. Fax in dr Valdemar's folder since it will be easier to sign form and fax back than send compounded med through epic if he approves.

## 2019-09-16 NOTE — Telephone Encounter (Signed)
Form was signed thank you 

## 2019-09-17 NOTE — Telephone Encounter (Signed)
Form faxed

## 2019-10-13 ENCOUNTER — Telehealth: Payer: Self-pay | Admitting: Family Medicine

## 2019-10-13 NOTE — Telephone Encounter (Signed)
Patient has physical scheduled for 7/29 and needing labs done.

## 2019-10-13 NOTE — Telephone Encounter (Signed)
Last labs 10/31/19 lipid, liver, bmp, tsh, free t4 and psa

## 2019-10-14 ENCOUNTER — Other Ambulatory Visit: Payer: Self-pay | Admitting: *Deleted

## 2019-10-14 DIAGNOSIS — E038 Other specified hypothyroidism: Secondary | ICD-10-CM

## 2019-10-14 DIAGNOSIS — E785 Hyperlipidemia, unspecified: Secondary | ICD-10-CM

## 2019-10-14 DIAGNOSIS — Z125 Encounter for screening for malignant neoplasm of prostate: Secondary | ICD-10-CM

## 2019-10-14 DIAGNOSIS — Z0000 Encounter for general adult medical examination without abnormal findings: Secondary | ICD-10-CM

## 2019-10-14 NOTE — Telephone Encounter (Signed)
Lipid, liver, metabolic 7, TSH, free T4, PSA Hyperlipidemia, screening for prostate cancer, subclinical hypothyroidism

## 2019-10-14 NOTE — Telephone Encounter (Signed)
Orders put in and pt was notified.  

## 2019-10-20 DIAGNOSIS — G5602 Carpal tunnel syndrome, left upper limb: Secondary | ICD-10-CM | POA: Diagnosis not present

## 2019-11-10 DIAGNOSIS — E039 Hypothyroidism, unspecified: Secondary | ICD-10-CM | POA: Diagnosis not present

## 2019-11-10 DIAGNOSIS — Z0000 Encounter for general adult medical examination without abnormal findings: Secondary | ICD-10-CM | POA: Diagnosis not present

## 2019-11-10 DIAGNOSIS — E785 Hyperlipidemia, unspecified: Secondary | ICD-10-CM | POA: Diagnosis not present

## 2019-11-10 DIAGNOSIS — Z125 Encounter for screening for malignant neoplasm of prostate: Secondary | ICD-10-CM | POA: Diagnosis not present

## 2019-11-11 LAB — PSA: Prostate Specific Ag, Serum: 2.5 ng/mL (ref 0.0–4.0)

## 2019-11-11 LAB — BASIC METABOLIC PANEL
BUN/Creatinine Ratio: 20 (ref 10–24)
BUN: 23 mg/dL (ref 8–27)
CO2: 27 mmol/L (ref 20–29)
Calcium: 9.9 mg/dL (ref 8.6–10.2)
Chloride: 102 mmol/L (ref 96–106)
Creatinine, Ser: 1.16 mg/dL (ref 0.76–1.27)
GFR calc Af Amer: 74 mL/min/{1.73_m2} (ref >59)
GFR calc non Af Amer: 64 mL/min/{1.73_m2} (ref >59)
Glucose: 103 mg/dL — ABNORMAL HIGH (ref 65–99)
Potassium: 4.3 mmol/L (ref 3.5–5.2)
Sodium: 143 mmol/L (ref 134–144)

## 2019-11-11 LAB — HEPATIC FUNCTION PANEL
ALT: 19 IU/L (ref 0–44)
AST: 23 IU/L (ref 0–40)
Albumin: 4.8 g/dL (ref 3.8–4.8)
Alkaline Phosphatase: 109 IU/L (ref 48–121)
Bilirubin Total: 0.5 mg/dL (ref 0.0–1.2)
Bilirubin, Direct: 0.15 mg/dL (ref 0.00–0.40)
Total Protein: 7 g/dL (ref 6.0–8.5)

## 2019-11-11 LAB — LIPID PANEL
Chol/HDL Ratio: 3.1 ratio (ref 0.0–5.0)
Cholesterol, Total: 192 mg/dL (ref 100–199)
HDL: 62 mg/dL (ref >39)
LDL Chol Calc (NIH): 116 mg/dL — ABNORMAL HIGH (ref 0–99)
Triglycerides: 79 mg/dL (ref 0–149)
VLDL Cholesterol Cal: 14 mg/dL (ref 5–40)

## 2019-11-11 LAB — TSH: TSH: 6.65 u[IU]/mL — ABNORMAL HIGH (ref 0.450–4.500)

## 2019-11-11 LAB — T4, FREE: Free T4: 1.16 ng/dL (ref 0.82–1.77)

## 2019-11-19 ENCOUNTER — Ambulatory Visit (INDEPENDENT_AMBULATORY_CARE_PROVIDER_SITE_OTHER): Payer: Medicare HMO | Admitting: Family Medicine

## 2019-11-19 ENCOUNTER — Encounter: Payer: Self-pay | Admitting: Family Medicine

## 2019-11-19 ENCOUNTER — Other Ambulatory Visit: Payer: Self-pay

## 2019-11-19 VITALS — BP 118/74 | Temp 97.5°F | Ht 66.0 in | Wt 167.0 lb

## 2019-11-19 DIAGNOSIS — E039 Hypothyroidism, unspecified: Secondary | ICD-10-CM

## 2019-11-19 DIAGNOSIS — R413 Other amnesia: Secondary | ICD-10-CM | POA: Insufficient documentation

## 2019-11-19 DIAGNOSIS — Z0000 Encounter for general adult medical examination without abnormal findings: Secondary | ICD-10-CM | POA: Diagnosis not present

## 2019-11-19 DIAGNOSIS — E785 Hyperlipidemia, unspecified: Secondary | ICD-10-CM | POA: Diagnosis not present

## 2019-11-19 DIAGNOSIS — R7301 Impaired fasting glucose: Secondary | ICD-10-CM | POA: Diagnosis not present

## 2019-11-19 DIAGNOSIS — E038 Other specified hypothyroidism: Secondary | ICD-10-CM | POA: Insufficient documentation

## 2019-11-19 MED ORDER — ATORVASTATIN CALCIUM 40 MG PO TABS: 40.0000 mg | ORAL_TABLET | Freq: Every day | ORAL | 1 refills | Status: DC

## 2019-11-19 NOTE — Progress Notes (Addendum)
Subjective:    Patient ID: Dylan Chapman, male    DOB: 03/25/1951, 69 y.o.   MRN: [id number]  HPI AWV- Annual Wellness Visit  The patient was seen for their annual wellness visit. The patient's past medical history, surgical history, and family history were reviewed. Pertinent vaccines were reviewed ( tetanus, pneumonia, shingles, flu) The patient's medication list was reviewed and updated.  The height and weight were entered.  BMI recorded in electronic record elsewhere  Cognitive screening was completed. Outcome of Mini - Cog: missed one word on recall. Pt does have some concerns with short term memory.   Falls /depression screening electronically recorded within record elsewhere  Current tobacco usage: none (All patients who use tobacco were given written and verbal information on quitting)  Recent listing of emergency department/hospitalizations over the past year were reviewed.  current specialist the patient sees on a regular basis: ortho for wrist fracture earlier this year   Medicare annual wellness visit patient questionnaire was reviewed.  A written screening schedule for the patient for the next 5-10 years was given. Appropriate discussion of followup regarding next visit was discussed.  Results for orders placed or performed in visit on 10/14/19  Lipid panel  Result Value Ref Range   Cholesterol, Total 192 100 - 199 mg/dL   Triglycerides 79 0 - 149 mg/dL   HDL 62 >39 mg/dL   VLDL Cholesterol Cal 14 5 - 40 mg/dL   LDL Chol Calc (NIH) 116 (H) 0 - 99 mg/dL   Chol/HDL Ratio 3.1 0.0 - 5.0 ratio  Hepatic function panel  Result Value Ref Range   Total Protein 7.0 6.0 - 8.5 g/dL   Albumin 4.8 3.8 - 4.8 g/dL   Bilirubin Total 0.5 0.0 - 1.2 mg/dL   Bilirubin, Direct 0.15 0.00 - 0.40 mg/dL   Alkaline Phosphatase 109 48 - 121 IU/L   AST 23 0 - 40 IU/L   ALT 19 0 - 44 IU/L  Basic metabolic panel  Result Value Ref Range   Glucose 103 (H) 65 - 99 mg/dL   BUN 23 8 - 27  mg/dL   Creatinine, Ser 1.16 0.76 - 1.27 mg/dL   GFR calc non Af Amer 64 >59 mL/min/1.73   GFR calc Af Amer 74 >59 mL/min/1.73   BUN/Creatinine Ratio 20 10 - 24   Sodium 143 134 - 144 mmol/L   Potassium 4.3 3.5 - 5.2 mmol/L   Chloride 102 96 - 106 mmol/L   CO2 27 20 - 29 mmol/L   Calcium 9.9 8.6 - 10.2 mg/dL  TSH  Result Value Ref Range   TSH 6.650 (H) 0.450 - 4.500 uIU/mL  T4, free  Result Value Ref Range   Free T4 1.16 0.82 - 1.77 ng/dL  PSA  Result Value Ref Range   Prostate Specific Ag, Serum 2.5 0.0 - 4.0 ng/mL   Patient scored 25 out of 30 on [place] cognitive assessment   Review of Systems  Constitutional: Negative for activity change, appetite change and fever.  HENT: Negative for congestion and rhinorrhea.   Eyes: Negative for discharge.  Respiratory: Negative for cough and wheezing.   Cardiovascular: Negative for chest pain.  Gastrointestinal: Negative for abdominal pain, blood in stool and vomiting.  Genitourinary: Negative for difficulty urinating and frequency.  Musculoskeletal: Negative for neck pain.  Skin: Negative for rash.  Allergic/Immunologic: Negative for environmental allergies and food allergies.  Neurological: Negative for weakness and headaches.  Psychiatric/Behavioral: Negative for agitation.  Objective:   Physical Exam Constitutional:      Appearance: He is well-developed.  HENT:     Head: Normocephalic and atraumatic.     Right Ear: External ear normal.     Left Ear: External ear normal.     Nose: Nose normal.  Eyes:     Pupils: Pupils are equal, round, and reactive to light.  Neck:     Thyroid: No thyromegaly.  Cardiovascular:     Rate and Rhythm: Normal rate and regular rhythm.     Heart sounds: Normal heart sounds. No murmur heard.   Pulmonary:     Effort: Pulmonary effort is normal. No respiratory distress.     Breath sounds: Normal breath sounds. No wheezing.  Abdominal:     General: Bowel sounds are normal. There is  no distension.     Palpations: Abdomen is soft. There is no mass.     Tenderness: There is no abdominal tenderness.  Genitourinary:    Penis: Normal.   Musculoskeletal:        General: Normal range of motion.     Cervical back: Normal range of motion and neck supple.  Lymphadenopathy:     Cervical: No cervical adenopathy.  Skin:    General: Skin is warm and dry.     Findings: No erythema.  Neurological:     Mental Status: He is alert.     Motor: No abnormal muscle tone.  Psychiatric:        Behavior: Behavior normal.        Judgment: Judgment normal.     Prostate exam normal Fall Risk  11/19/2019 09/05/2017 09/03/2016 08/18/2015 08/18/2014  Falls in the past year? 1 No No No No  Number falls in past yr: 0 - - - -  Injury with Fall? 1 - - - -  Risk for fall due to : History of fall(s) - - - -  Follow up Education provided;Falls evaluation completed - - - -        Assessment & Plan:  1. Routine general medical examination at a health care facility Adult wellness-complete.wellness physical was conducted today. Importance of diet and exercise were discussed in detail.  In addition to this a discussion regarding safety was also covered. We also reviewed over immunizations and gave recommendations regarding current immunization needed for age.  In addition to this additional areas were also touched on including: Preventative health exams needed:  Colonoscopy 2023 next 1  Patient was advised yearly wellness exam   2. Subclinical hypothyroidism We will recheck this again in 6 months time - TSH - T4, free  3. Fasting hyperglycemia Healthy diet regular activity recommended - Glucose, random  4. Hyperlipidemia, unspecified hyperlipidemia type We will do follow-up lipid profile healthier diet recommended regular physical activity recommended - Lipid panel Short-term memory dysfunction-no signs of dementia currently.  Healthy diet regular activity staying physically active  recommended

## 2019-12-04 ENCOUNTER — Other Ambulatory Visit: Payer: Self-pay | Admitting: Family Medicine

## 2019-12-09 ENCOUNTER — Other Ambulatory Visit: Payer: Self-pay | Admitting: Family Medicine

## 2019-12-15 DIAGNOSIS — M25532 Pain in left wrist: Secondary | ICD-10-CM | POA: Diagnosis not present

## 2019-12-15 DIAGNOSIS — S52522D Torus fracture of lower end of left radius, subsequent encounter for fracture with routine healing: Secondary | ICD-10-CM | POA: Diagnosis not present

## 2019-12-15 DIAGNOSIS — G5602 Carpal tunnel syndrome, left upper limb: Secondary | ICD-10-CM | POA: Diagnosis not present

## 2019-12-22 DIAGNOSIS — R69 Illness, unspecified: Secondary | ICD-10-CM | POA: Diagnosis not present

## 2020-01-04 ENCOUNTER — Encounter: Payer: Self-pay | Admitting: Family Medicine

## 2020-01-05 DIAGNOSIS — M25532 Pain in left wrist: Secondary | ICD-10-CM | POA: Diagnosis not present

## 2020-01-11 ENCOUNTER — Encounter: Payer: Self-pay | Admitting: Family Medicine

## 2020-01-19 ENCOUNTER — Other Ambulatory Visit: Payer: Self-pay | Admitting: *Deleted

## 2020-01-19 DIAGNOSIS — Z23 Encounter for immunization: Secondary | ICD-10-CM

## 2020-01-19 DIAGNOSIS — G5602 Carpal tunnel syndrome, left upper limb: Secondary | ICD-10-CM | POA: Diagnosis not present

## 2020-01-19 DIAGNOSIS — S52522D Torus fracture of lower end of left radius, subsequent encounter for fracture with routine healing: Secondary | ICD-10-CM | POA: Diagnosis not present

## 2020-01-19 MED ORDER — SHINGRIX 50 MCG/0.5ML IM SUSR: 0.5000 mL | Freq: Once | INTRAMUSCULAR | 1 refills | Status: AC

## 2020-01-20 DIAGNOSIS — R69 Illness, unspecified: Secondary | ICD-10-CM | POA: Diagnosis not present

## 2020-03-01 ENCOUNTER — Encounter: Payer: Self-pay | Admitting: Family Medicine

## 2020-03-01 MED ORDER — TRIAMCINOLONE ACETONIDE 0.1 % EX CREA: TOPICAL_CREAM | CUTANEOUS | 0 refills | Status: DC

## 2020-03-01 NOTE — Telephone Encounter (Signed)
May send with directions apply bid prn Triamcinolone 0.1 cream 240 g

## 2020-03-01 NOTE — Addendum Note (Signed)
Addended by: Vicente Males on: 03/01/2020 11:48 AM   Modules accepted: Orders

## 2020-03-02 ENCOUNTER — Other Ambulatory Visit: Payer: Self-pay

## 2020-03-02 MED ORDER — ATORVASTATIN CALCIUM 40 MG PO TABS
40.0000 mg | ORAL_TABLET | Freq: Every day | ORAL | 0 refills | Status: DC
Start: 2020-03-02 — End: 2020-06-16

## 2020-03-09 DIAGNOSIS — Z0100 Encounter for examination of eyes and vision without abnormal findings: Secondary | ICD-10-CM | POA: Diagnosis not present

## 2020-05-11 ENCOUNTER — Encounter: Payer: Self-pay | Admitting: Family Medicine

## 2020-05-12 NOTE — Telephone Encounter (Signed)
Nurses This is a compounded dosing that the pharmacy does Please call them and asked them specifically how it should be written out and then we can write it out and I can sign it  Sorry for this extra step but this is not something that I commit to memory for specialty drugs that we use infrequently

## 2020-05-12 NOTE — Telephone Encounter (Signed)
What directions do you want to use

## 2020-05-12 NOTE — Telephone Encounter (Signed)
Please write it out on a prescription  Tomorrow I will come by and I will let you know when I get there-most likely closer to lunch  Someone could bring that outside I will sign the prescription then it can be faxed

## 2020-05-12 NOTE — Telephone Encounter (Signed)
Nurses Please communicate with Dylan Chapman's I am fine with this ketoprofen compound being renewed 60 g is fine with 3 refills Please also send Roi Jafari a MyChart message letting him know this has been completed thank you

## 2020-05-16 ENCOUNTER — Other Ambulatory Visit: Payer: Self-pay | Admitting: Family Medicine

## 2020-06-02 ENCOUNTER — Telehealth (INDEPENDENT_AMBULATORY_CARE_PROVIDER_SITE_OTHER): Payer: Medicare HMO | Admitting: Family Medicine

## 2020-06-02 ENCOUNTER — Encounter: Payer: Self-pay | Admitting: Family Medicine

## 2020-06-02 ENCOUNTER — Telehealth: Payer: Self-pay | Admitting: Family Medicine

## 2020-06-02 DIAGNOSIS — U071 COVID-19: Secondary | ICD-10-CM

## 2020-06-02 NOTE — Progress Notes (Signed)
   Subjective:    Patient ID: Dylan Chapman, male    DOB: 26-Sep-1950, 70 y.o.   MRN: [id number]  HPI Patient started with symptoms on Monday with sore throat not feeling well fatigued tired some runny nose since then has developed low-grade fever mild headaches as well as feeling fatigued rundown and also intermittent coughing but night denies wheezing or shortness of breath no vomiting or diarrhea. Patient's overall health is fairly good but he is 70 years old and he is vaccinated  Patient took a home Covid test which was strongly positive he texted me a message/picture that clearly showed a positive test Virtual Visit via Video Note  I connected with Dylan Chapman on 06/02/20 at 11:30 AM EST by a video enabled telemedicine application and verified that I am speaking with the correct person using two identifiers.  Location: Patient: Home Provider: Office   I discussed the limitations of evaluation and management by telemedicine and the availability of in person appointments. The patient expressed understanding and agreed to proceed.  History of Present Illness:    Observations/Objective:   Assessment and Plan:   Follow Up Instructions:    I discussed the assessment and treatment plan with the patient. The patient was provided an opportunity to ask questions and all were answered. The patient agreed with the plan and demonstrated an understanding of the instructions.   The patient was advised to call back or seek an in-person evaluation if the symptoms worsen or if the condition fails to improve as anticipated.  I provided 25 minutes of non-face-to-face time during this encounter.   Sallee Lange, MD    Review of Systems Please see above    Objective:   Physical Exam  Patient is able to talk in full sentences without respiratory distress but does have intermittent coughing throughout the interview unable to do video      Assessment & Plan:  Covid infection Referral  to infusion center I believe he would benefit from infusion but if that is not available molnupiravir would be a good alternative hopefully available at [facility] drug which would be close to where he lives  Warning signs regarding progressive illness were discussed in detail.  MyChart message will be sent to him as well.  Patient is to keep [place] posted what he hears from the infusion center so if he needs the antiviral sent in we can do so

## 2020-06-02 NOTE — Telephone Encounter (Signed)
Front Please go ahead and put Dylan Chapman on my schedule for this morning please indicate-as arrived-diagnosis COVID thank you

## 2020-06-02 NOTE — Patient Instructions (Signed)

## 2020-06-03 ENCOUNTER — Ambulatory Visit (INDEPENDENT_AMBULATORY_CARE_PROVIDER_SITE_OTHER): Payer: Medicare HMO | Admitting: Family Medicine

## 2020-06-03 ENCOUNTER — Other Ambulatory Visit: Payer: Self-pay | Admitting: Family

## 2020-06-03 ENCOUNTER — Other Ambulatory Visit: Payer: Self-pay

## 2020-06-03 ENCOUNTER — Telehealth: Payer: Self-pay | Admitting: Family

## 2020-06-03 DIAGNOSIS — U071 COVID-19: Secondary | ICD-10-CM | POA: Diagnosis not present

## 2020-06-03 DIAGNOSIS — E7849 Other hyperlipidemia: Secondary | ICD-10-CM

## 2020-06-03 NOTE — Progress Notes (Signed)
I connected by phone with Dylan Chapman on 06/03/2020 at 10:12 AM to discuss the potential use of a new treatment for mild to moderate COVID-19 viral infection in non-hospitalized patients.  This patient is a 70 y.o. male that meets the FDA criteria for Emergency Use Authorization of COVID monoclonal antibody sotrovimab.  Has a (+) direct SARS-CoV-2 viral test result  Has mild or moderate COVID-19   Is NOT hospitalized due to COVID-19  Is within 10 days of symptom onset  Has at least one of the high risk factor(s) for progression to severe COVID-19 and/or hospitalization as defined in EUA.  Specific high risk criteria : Older age (>/= 69 yo) and Cardiovascular disease or hypertension   I have spoken and communicated the following to the patient or parent/caregiver regarding COVID monoclonal antibody treatment:  1. FDA has authorized the emergency use for the treatment of mild to moderate COVID-19 in adults and pediatric patients with positive results of direct SARS-CoV-2 viral testing who are 72 years of age and older weighing at least 40 kg, and who are at high risk for progressing to severe COVID-19 and/or hospitalization.  2. The significant known and potential risks and benefits of COVID monoclonal antibody, and the extent to which such potential risks and benefits are unknown.  3. Information on available alternative treatments and the risks and benefits of those alternatives, including clinical trials.  4. Patients treated with COVID monoclonal antibody should continue to self-isolate and use infection control measures (e.g., wear mask, isolate, social distance, avoid sharing personal items, clean and disinfect "high touch" surfaces, and frequent handwashing) according to CDC guidelines.   5. The patient or parent/caregiver has the option to accept or refuse COVID monoclonal antibody treatment.  After reviewing this information with the patient, the patient has agreed to receive one  of the available covid 19 monoclonal antibodies and will be provided an appropriate fact sheet prior to infusion.   Mauricio Po, FNP 06/03/2020 10:12 AM

## 2020-06-03 NOTE — Progress Notes (Signed)
   Subjective:    Patient ID: Dylan Chapman, male    DOB: October 18, 1950, 70 y.o.   MRN: [id number]  HPI Patient arrives for evaluation after testing positive for Covid Patient here today for further evaluation Having some coughing fatigue tiredness feels bad Scheduled to have antibody infusion tomorrow Does have O2 sat monitor at home staying in the mid 90s Denies wheezing or difficulty breathing denies nausea vomiting Energy level very poor Patient is immunized.  Review of Systems Please see above    Objective:   Physical Exam Lungs clear bilateral heart regular pulse normal extremities no edema O2 sat 98%       Assessment & Plan:  Patient certainly weakened by this but will benefit from the antibody infusion He is to rest up over the next 24 hours monitor his O2 sat with his meter if it drops into the 90 or below to go to ER Notify us if any problems call if any problems go with antibody infusion tomorrow we will do a phone follow-up over the weekend plus also phone follow-up early next week warning signs discussed in detail

## 2020-06-03 NOTE — Telephone Encounter (Signed)
Called to discuss with patient about COVID-19 symptoms and the use of one of the available treatments for those with mild to moderate Covid symptoms and at a high risk of hospitalization.  Pt appears to qualify for outpatient treatment due to co-morbid conditions and/or a member of an at-risk group in accordance with the FDA Emergency Use Authorization.    Symptom onset: 05/30/20 Vaccinated: Yes Booster? Yes  Immunocompromised? No Qualifiers: Age, hyperlipidemia   Mr. Rinck was seen through Telehealth visit yesterday and has been referred for evaluation of Covid treatment. Symptoms started on 2/7 with fatigue, cough, and low grade fever. Now having occasional cough, sore throat, headaches, and fever. We discussed the risks, benefitts and potential financial costs associated with treatment with Sotrovimab and Remdesivir. He wishes to proceed with Sotrovimab.   [place],   We contacted you because you were recently diagnosed with COVID-19 and may benefit from a new treatment for mild to moderate disease. This treatment helps reduce the chance of being hospitalized. For some patients with medical conditions that may increase the chances of an infection, the treatment also decreases the risk for serious symptoms related to COVID-19.   The Food and Drug Administration (FDA) approved emergency use of a new drug to treat patients with mild to moderate symptoms who have risk factors that could cause severe symptoms related to COVID-19. This new treatment is a monoclonal antibody. It works by attaching like a magnet to the SARS-CoV2 virus (the virus that causes COVID-19) and stops it from infecting more cells in your body. It does not kill the virus, but it prevents it from spreading throughout your body with the hope that it will decrease your symptoms after it is administered.   This new drug is an intravenous (IV) infusion called Sotrovimab that is given over one 30-minute session in our  [facility] outpatient infusion clinic. You will need to stay about 60 minutes after the infusion to ensure you are tolerating it well and to watch for any allergic reaction to the medication. More information will be given to you at the time of your appointment.   Important information:  . The potential side effects: 2-4% of recipients experience nausea, vomiting, diarrhea, dizziness, headaches, itching, worsening fevers or chills for around 24 hours. . There have been no serious infusion-related reactions. . Of the more than 3,000 patients who received the infusion, only one had an allergic response that ended once the infusion was stopped. This is why we monitor all of our patients closely for 60 minutes after the infusion.  . The COVID-19 vaccine (including boosters) must be delayed at least 90 days after receiving this infusion.  . The medication itself is free, but your insurance will be charged an infusion fee. The amount you may owe later varies from insurance to insurance. If you do not have insurance, we can put you in touch with our billing department. Please contact your insurance agent to discuss prior to your appointment if you would like further details about billing specific to your policy. The CMS code is: M32  . If you have been tested outside of a [facility], you MUST bring a copy of your positive test with you the morning of your appointment. You may take a photo of this and upload to your MyChart portal,  have the testing facility fax the result to [phone] or email a copy to [email or web address].    You have been scheduled to receive the monoclonal antibody  therapy at [place]:  06/04/20 at 10:30am    The address for the infusion clinic site is:   --The GPS address is [address], and the parking is located near the [employer] building where you will see a "COVID19 Infusion" feather banner marking the entrance to parking. (See photos below.)             --Enter into the 2nd entrance where the "wave, flag banner" is at the road. Turn into this 2nd entrance and immediately turn left or right to park in one of the marked spaces.   --Please stay in your car and call the desk for assistance inside at [phone]. Let us know which space you are in.    --The average time in the department is roughly 90 minutes to two hours for monoclonal treatment. This includes preparation of the medication, IV start and the required 60-minute monitoring after the infusion.     Should you develop worsening shortness of breath, chest pain or severe breathing problems, please do not wait for this appointment and go to the emergency room for evaluation and treatment instead. You will undergo another oxygen screen before your infusion to ensure this is the best treatment option for you. There is a chance that the best decision may be to send you to the emergency room for evaluation at the time of your appointment.    The day of your visit, you should: Marland Kitchen Get plenty of rest the night before and drink plenty of water. . Eat a light meal/snack before coming and take your medications as prescribed.  . Wear warm, comfortable clothes with a shirt that can roll-up over the elbow (will need IV start).  . Wear a mask.  . Consider bringing an activity to help pass the time.   Terri Piedra, NP 06/03/2020 10:11 AM

## 2020-06-04 ENCOUNTER — Ambulatory Visit (HOSPITAL_COMMUNITY)
Admission: RE | Admit: 2020-06-04 | Discharge: 2020-06-04 | Disposition: A | Discharge status: Home / Self Care | Payer: Medicare HMO | Source: Ambulatory Visit | Attending: Pulmonary Disease | Admitting: Pulmonary Disease

## 2020-06-04 DIAGNOSIS — U071 COVID-19: Secondary | ICD-10-CM

## 2020-06-04 DIAGNOSIS — E7849 Other hyperlipidemia: Secondary | ICD-10-CM | POA: Insufficient documentation

## 2020-06-04 MED ORDER — FAMOTIDINE IN NACL 20-0.9 MG/50ML-% IV SOLN: 20.0000 mg | Freq: Once | INTRAVENOUS | Status: DC | PRN

## 2020-06-04 MED ORDER — SOTROVIMAB 500 MG/8ML IV SOLN
500.0000 mg | Freq: Once | INTRAVENOUS | Status: AC
  Administered 2020-06-04: 500 mg via INTRAVENOUS

## 2020-06-04 MED ORDER — METHYLPREDNISOLONE SODIUM SUCC 125 MG IJ SOLR: 125.0000 mg | Freq: Once | INTRAMUSCULAR | Status: DC | PRN

## 2020-06-04 MED ORDER — DIPHENHYDRAMINE HCL 50 MG/ML IJ SOLN: 50.0000 mg | Freq: Once | INTRAMUSCULAR | Status: DC | PRN

## 2020-06-04 MED ORDER — SODIUM CHLORIDE 0.9 % IV SOLN: INTRAVENOUS | Status: DC | PRN

## 2020-06-04 MED ORDER — ALBUTEROL SULFATE HFA 108 (90 BASE) MCG/ACT IN AERS: 2.0000 | INHALATION_SPRAY | Freq: Once | RESPIRATORY_TRACT | Status: DC | PRN

## 2020-06-04 MED ORDER — EPINEPHRINE 0.3 MG/0.3ML IJ SOAJ: 0.3000 mg | Freq: Once | INTRAMUSCULAR | Status: DC | PRN

## 2020-06-04 NOTE — Progress Notes (Signed)
Diagnosis: COVID-19  Physician: Dr. Patrick Wright  Procedure: Covid Infusion Clinic Med: Sotrovimab infusion - Provided patient with sotrovimab fact sheet for patients, parents, and caregivers prior to infusion.   Complications: No immediate complications noted  Discharge: Discharged home    

## 2020-06-04 NOTE — Progress Notes (Signed)
Patient reviewed Fact Sheet for Patients, Parents, and Caregivers for Emergency Use Authorization (EUA) of sotrovimab for the Treatment of Coronavirus. Patient also reviewed and is agreeable to the estimated cost of treatment. Patient is agreeable to proceed.   

## 2020-06-04 NOTE — Discharge Instructions (Signed)

## 2020-06-16 ENCOUNTER — Other Ambulatory Visit: Payer: Self-pay | Admitting: Family Medicine

## 2020-07-21 DIAGNOSIS — E785 Hyperlipidemia, unspecified: Secondary | ICD-10-CM | POA: Diagnosis not present

## 2020-07-21 DIAGNOSIS — E039 Hypothyroidism, unspecified: Secondary | ICD-10-CM | POA: Diagnosis not present

## 2020-07-21 DIAGNOSIS — R7301 Impaired fasting glucose: Secondary | ICD-10-CM | POA: Diagnosis not present

## 2020-07-22 LAB — LIPID PANEL
Chol/HDL Ratio: 3.1 ratio (ref 0.0–5.0)
Cholesterol, Total: 185 mg/dL (ref 100–199)
HDL: 59 mg/dL (ref >39)
LDL Chol Calc (NIH): 108 mg/dL — ABNORMAL HIGH (ref 0–99)
Triglycerides: 102 mg/dL (ref 0–149)
VLDL Cholesterol Cal: 18 mg/dL (ref 5–40)

## 2020-07-22 LAB — T4, FREE: Free T4: 1.13 ng/dL (ref 0.82–1.77)

## 2020-07-22 LAB — TSH: TSH: 6.27 u[IU]/mL — ABNORMAL HIGH (ref 0.450–4.500)

## 2020-07-22 LAB — GLUCOSE, RANDOM: Glucose: 93 mg/dL (ref 65–99)

## 2020-08-09 ENCOUNTER — Encounter: Payer: Self-pay | Admitting: Family Medicine

## 2020-09-20 ENCOUNTER — Other Ambulatory Visit: Payer: Self-pay | Admitting: Family Medicine

## 2020-11-22 ENCOUNTER — Other Ambulatory Visit: Payer: Self-pay | Admitting: Family Medicine

## 2020-11-22 MED ORDER — FLUOXETINE HCL 10 MG PO CAPS: 10.0000 mg | ORAL_CAPSULE | Freq: Every day | ORAL | 3 refills | Status: DC

## 2020-11-22 MED ORDER — ALPRAZOLAM 0.5 MG PO TABS: 0.5000 mg | ORAL_TABLET | Freq: Three times a day (TID) | ORAL | 0 refills | Status: DC | PRN

## 2020-12-17 ENCOUNTER — Other Ambulatory Visit: Payer: Self-pay | Admitting: Family Medicine

## 2020-12-24 ENCOUNTER — Other Ambulatory Visit: Payer: Self-pay

## 2020-12-24 ENCOUNTER — Ambulatory Visit (INDEPENDENT_AMBULATORY_CARE_PROVIDER_SITE_OTHER): Payer: Medicare HMO

## 2020-12-24 DIAGNOSIS — Z0000 Encounter for general adult medical examination without abnormal findings: Secondary | ICD-10-CM | POA: Diagnosis not present

## 2020-12-24 NOTE — Patient Instructions (Signed)
Health Maintenance, Male Adopting a healthy lifestyle and getting preventive care are important in promoting health and wellness. Ask your health care provider about: The right schedule for you to have regular tests and exams. Things you can do on your own to prevent diseases and keep yourself healthy. What should I know about diet, weight, and exercise? Eat a healthy diet  Eat a diet that includes plenty of vegetables, fruits, low-fat dairy products, and lean protein. Do not eat a lot of foods that are high in solid fats, added sugars, or sodium. Maintain a healthy weight Body mass index (BMI) is a measurement that can be used to identify possible weight problems. It estimates body fat based on height and weight. Your health care provider can help determine your BMI and help you achieve or maintain a healthy weight. Get regular exercise Get regular exercise. This is one of the most important things you can do for your health. Most adults should: Exercise for at least 150 minutes each week. The exercise should increase your heart rate and make you sweat (moderate-intensity exercise). Do strengthening exercises at least twice a week. This is in addition to the moderate-intensity exercise. Spend less time sitting. Even light physical activity can be beneficial. Watch cholesterol and blood lipids Have your blood tested for lipids and cholesterol at 69 years of age, then have this test every 5 years. You may need to have your cholesterol levels checked more often if: Your lipid or cholesterol levels are high. You are older than 69 years of age. You are at high risk for heart disease. What should I know about cancer screening? Many types of cancers can be detected early and may often be prevented. Depending on your health history and family history, you may need to have cancer screening at various ages. This may include screening for: Colorectal cancer. Prostate cancer. Skin cancer. Lung  cancer. What should I know about heart disease, diabetes, and high blood pressure? Blood pressure and heart disease High blood pressure causes heart disease and increases the risk of stroke. This is more likely to develop in people who have high blood pressure readings, are of African descent, or are overweight. Talk with your health care provider about your target blood pressure readings. Have your blood pressure checked: Every 3-5 years if you are 18-39 years of age. Every year if you are 40 years old or older. If you are between the ages of 65 and 75 and are a current or former smoker, ask your health care provider if you should have a one-time screening for abdominal aortic aneurysm (AAA). Diabetes Have regular diabetes screenings. This checks your fasting blood sugar level. Have the screening done: Once every three years after age 45 if you are at a normal weight and have a low risk for diabetes. More often and at a younger age if you are overweight or have a high risk for diabetes. What should I know about preventing infection? Hepatitis B If you have a higher risk for hepatitis B, you should be screened for this virus. Talk with your health care provider to find out if you are at risk for hepatitis B infection. Hepatitis C Blood testing is recommended for: Everyone born from 1945 through 1965. Anyone with known risk factors for hepatitis C. Sexually transmitted infections (STIs) You should be screened each year for STIs, including gonorrhea and chlamydia, if: You are sexually active and are younger than 69 years of age. You are older than 69 years   of age and your health care provider tells you that you are at risk for this type of infection. Your sexual activity has changed since you were last screened, and you are at increased risk for chlamydia or gonorrhea. Ask your health care provider if you are at risk. Ask your health care provider about whether you are at high risk for HIV.  Your health care provider may recommend a prescription medicine to help prevent HIV infection. If you choose to take medicine to prevent HIV, you should first get tested for HIV. You should then be tested every 3 months for as long as you are taking the medicine. Follow these instructions at home: Lifestyle Do not use any products that contain nicotine or tobacco, such as cigarettes, e-cigarettes, and chewing tobacco. If you need help quitting, ask your health care provider. Do not use street drugs. Do not share needles. Ask your health care provider for help if you need support or information about quitting drugs. Alcohol use Do not drink alcohol if your health care provider tells you not to drink. If you drink alcohol: Limit how much you have to 0-2 drinks a day. Be aware of how much alcohol is in your drink. In the [place] one drink equals one 12 oz bottle of beer (355 mL), one 5 oz glass of wine (148 mL), or one 1 oz glass of hard liquor (44 mL). General instructions Schedule regular health, dental, and eye exams. Stay current with your vaccines. Tell your health care provider if: You often feel depressed. You have ever been abused or do not feel safe at home. Summary Adopting a healthy lifestyle and getting preventive care are important in promoting health and wellness. Follow your health care provider's instructions about healthy diet, exercising, and getting tested or screened for diseases. Follow your health care provider's instructions on monitoring your cholesterol and blood pressure. This information is not intended to replace advice given to you by your health care provider. Make sure you discuss any questions you have with your health care provider. Document Revised: 06/17/2020 Document Reviewed: 04/02/2018 Elsevier Patient Education  2022 [employer]  

## 2020-12-24 NOTE — Progress Notes (Signed)
Subjective:   Dylan Chapman is a 70 y.o. male who presents for Medicare Annual/Subsequent preventive examination.  I connected with  Dylan Chapman on 12/24/20 by audio enabled telemedicine application and verified that I am speaking with the correct person using two identifiers.  I discussed the limitations of evaluation and management by telemedicine. The patient expressed understanding and agreed to proceed.   Location of patient: Home Location of provider: Office  List of person's attending visit with patient and their role:  Dylan Chapman (patient) and Dylan Spence, RN  Review of Systems  - Defer to PCP      Objective:    There were no vitals filed for this visit. There is no height or weight on file to calculate BMI.  Advanced Directives 12/24/2020 05/24/2019 10/09/2016  Does Patient Have a Medical Advance Directive? Yes No No  Type of Advance Directive Living will - -  Does patient want to make changes to medical advance directive? No - Patient declined - -  Would patient like information on creating a medical advance directive? - No - Patient declined No - Patient declined    Current Medications (verified) Outpatient Encounter Medications as of 12/24/2020  Medication Sig   atorvastatin (LIPITOR) 40 MG tablet Take 1 tablet by mouth once daily   Ketoprofen POWD AS DIRECTED.   Multiple Vitamin (MULTIVITAMIN WITH MINERALS) TABS tablet Take 1 tablet by mouth daily.   triamcinolone cream (KENALOG) 0.1 % Apply to affected areas BID   ALPRAZolam (XANAX) 0.5 MG tablet Take 1 tablet (0.5 mg total) by mouth 3 (three) times daily as needed for sleep or anxiety. (Patient not taking: Reported on 12/24/2020)   FLUoxetine (PROZAC) 10 MG capsule Take 1 capsule (10 mg total) by mouth daily. (Patient not taking: Reported on 12/24/2020)   ibuprofen (ADVIL) 800 MG tablet Take 1 tablet (800 mg total) by mouth every 8 (eight) hours as needed. (Patient not taking: Reported on 12/24/2020)   PRESCRIPTION  MEDICATION Compound for headaches   propranolol (INDERAL) 80 MG tablet Take one tab 3 times daily as needed for cluster headaches (Patient not taking: Reported on 12/24/2020)   No facility-administered encounter medications on file as of 12/24/2020.    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 Gala Romney: normal rectum and scattered sigmoid diverticulum    COLONOSCOPY N/A 10/09/2016   Dr. Gala Romney: non-bleeding internal hemorrhoids, diverticulosis in sigmoid and descending colon, one 4 mm tubular adenoma at recto-sigmoid s/p cold snare removal. Surveillance in 7 years   ESOPHAGOGASTRODUODENOSCOPY N/A 10/09/2016   Dr. Gala Romney: LA Grade A esophagitis, normal stomach, normal ampulla and second portion of duodenum, no specimens collected   HERNIA REPAIR Left    left inguinal   LAMINECTOMY     MALONEY DILATION N/A 10/09/2016   Procedure: Venia Minks DILATION;  Surgeon: Daneil Dolin, MD;  Location: [facility];  Service: Endoscopy;  Laterality: N/A;   SHOULDER SURGERY     Family History  Problem Relation Age of Onset   Heart disease Father    Prostate cancer Father    Colon cancer Neg Hx  Colon polyps Neg Hx    Social History   Socioeconomic History   Marital status: Married    Spouse name: Not on file   Number of children: 2   Years of education: Not on file   Highest education level: Bachelor's degree (e.g., BA, AB, BS)  Occupational History   Occupation: self-employed    Comment: [employer]   Tobacco Use   Smoking status: Former    Types: Cigarettes   Smokeless tobacco: Never  Vaping Use   Vaping Use: Never used  Substance and Sexual  Activity   Alcohol use: Yes    Alcohol/week: 3.0 standard drinks    Types: 3 Glasses of wine per week    Comment: 3 glasses wine/week; occ beer   Drug use: No   Sexual activity: Not Currently  Other Topics Concern   Not on file  Social History Narrative   2 daughters   Social Determinants of Health   Financial Resource Strain: Low Risk    Difficulty of Paying Living Expenses: Not hard at all  Food Insecurity: No Food Insecurity   Worried About Charity fundraiser in the Last Year: Never true   Arboriculturist in the Last Year: Never true  Transportation Needs: No Transportation Needs   Lack of Transportation (Medical): No   Lack of Transportation (Non-Medical): No  Physical Activity: Inactive   Days of Exercise per Week: 0 days   Minutes of Exercise per Session: 0 min  Stress: No Stress Concern Present   Feeling of Stress : Not at all  Social Connections: Moderately Integrated   Frequency of Communication with Friends and Family: More than three times a week   Frequency of Social Gatherings with Friends and Family: More than three times a week   Attends Religious Services: More than 4 times per year   Active Member of [employer] or Organizations: Yes   Attends Archivist Meetings: More than 4 times per year   Marital Status: Widowed    Tobacco Counseling Counseling given: Not Answered   Clinical Intake:  Pre-visit preparation completed: Yes  Pain : No/denies pain   Nutritional Risks: Failure to thrive (lost wife 1 month ago) Diabetes: No  How often do you need to have someone help you when you read instructions, pamphlets, or other written materials from your doctor or pharmacy?: 1 - Never What is the last grade level you completed in school?: 4 year college graduate, BS  Diabetic? No  Interpreter Needed?: No  Information entered by :: Gerre Couch, RN   Activities of Daily Living In your present state of health, do you have any difficulty performing  the following activities: 12/24/2020  Hearing? Y  Comment bilateral hearing aids  Vision? N  Difficulty concentrating or making decisions? Y  Comment some difficulty remembering  Walking or climbing stairs? N  Dressing or bathing? N  Doing errands, shopping? N  Preparing Food and eating ? N  Using the Toilet? N  In the past six months, have you accidently leaked urine? N  Do you have problems with loss of bowel control? N  Managing your Medications? N  Managing your Finances? N  Housekeeping or managing your Housekeeping? N  Some recent data might be hidden    Patient Care Team: Kathyrn Drown, MD as PCP - General (Family Medicine)  Indicate any recent Medical Services you may have received from other than [facility] providers in the past year (date may be  approximate).     Assessment:   This is a routine wellness examination for Muaad.  Hearing/Vision screen No results found.  Dietary issues and exercise activities discussed: Current Exercise Habits: The patient does not participate in regular exercise at present, Exercise limited by: Other - see comments (lack of motivation)   Goals Addressed   None    Depression Screen PHQ 2/9 Scores 12/24/2020 11/19/2019 11/18/2018 09/05/2017 09/03/2016 08/18/2015 08/18/2014  PHQ - 2 Score 0 0 0 0 0 0 0  PHQ- 9 Score - - 0 - - - -    Fall Risk Fall Risk  12/24/2020 11/19/2019 09/05/2017 09/03/2016 08/18/2015  Falls in the past year? 0 1 No No No  Number falls in past yr: 0 0 - - -  Injury with Fall? 0 1 - - -  Risk for fall due to : No Fall Risks History of fall(s) - - -  Follow up Falls evaluation completed Education provided;Falls evaluation completed - - -    FALL RISK PREVENTION PERTAINING TO THE HOME:  Any stairs in or around the home? Yes  If so, are there any without handrails? Yes  Home free of loose throw rugs in walkways, pet beds, electrical cords, etc? Yes  Adequate lighting in your home to reduce risk of falls? Yes   ASSISTIVE  DEVICES UTILIZED TO PREVENT FALLS:  Life alert? No  Use of a cane, walker or w/c? No  Grab bars in the bathroom? No  Shower chair or bench in shower? Yes  Elevated toilet seat or a handicapped toilet? No   TIMED UP AND GO:  Was the test performed?  N/A .  Length of time to ambulate: N/A Did not perform  Cognitive Function:     6CIT Screen 12/24/2020  What Year? 0 points  What month? 0 points  What time? 0 points  Count back from 20 0 points  Months in reverse 0 points  Repeat phrase 0 points  Total Score 0    Immunizations Immunization History  Administered Date(s) Administered   Influenza-Unspecified 12/16/2017   Moderna Sars-Covid-2 Vaccination 05/14/2019, 06/19/2019   Pneumococcal Conjugate-13 09/03/2016   Pneumococcal Polysaccharide-23 09/05/2017   Td 03/03/2017    TDAP status: Up to date  Flu Vaccine status: Due, Education has been provided regarding the importance of this vaccine. Advised may receive this vaccine at local pharmacy or Health Dept. Aware to provide a copy of the vaccination record if obtained from local pharmacy or Health Dept. Verbalized acceptance and understanding.  Pneumococcal vaccine status: Up to date  Covid-19 vaccine status: Completed vaccines  Qualifies for Shingles Vaccine? Yes   Zostavax completed No   Shingrix Completed?: No.    Education has been provided regarding the importance of this vaccine. Patient has been advised to call insurance company to determine out of pocket expense if they have not yet received this vaccine. Advised may also receive vaccine at local pharmacy or Health Dept. Verbalized acceptance and understanding.  Screening Tests Health Maintenance  Topic Date Due   Zoster Vaccines- Shingrix (1 of 2) Never done   COVID-19 Vaccine (3 - Booster for Moderna series) 11/16/2019   INFLUENZA VACCINE  11/21/2020   COLONOSCOPY (Pts 45-84yr Insurance coverage will need to be confirmed)  10/09/2021   TETANUS/TDAP   03/04/2027   Hepatitis C Screening  Completed   PNA vac Low Risk Adult  Completed   HPV VACCINES  Aged Out    Health Maintenance  Health Maintenance Due  Topic Date Due   Zoster Vaccines- Shingrix (1 of 2) Never done   COVID-19 Vaccine (3 - Booster for Moderna series) 11/16/2019   INFLUENZA VACCINE  11/21/2020    Colorectal cancer screening: Type of screening: Colonoscopy. Completed 10/09/2016. Repeat every 5 years  Lung Cancer Screening: (Low Dose CT Chest recommended if Age 99-80 years, 30 pack-year currently smoking OR have quit w/in 15years.) does not qualify.    Additional Screening:  Hepatitis C Screening: does qualify; Completed: 10/29/2016  Vision Screening: Recommended annual ophthalmology exams for early detection of glaucoma and other disorders of the eye. Is the patient up to date with their annual eye exam?  Yes  Who is the provider or what is the name of the office in which the patient attends annual eye exams? [facility] - Dr. Phineas Douglas 03/09/20 If pt is not established with a provider, would they like to be referred to a provider to establish care?  N/A .   Dental Screening: Recommended annual dental exams for proper oral hygiene    Plan:     I have personally reviewed and noted the following in the patient's chart:   Medical and social history Use of alcohol, tobacco or illicit drugs  Current medications and supplements including opioid prescriptions. Patient is not currently taking opioid prescriptions. Functional ability and status Nutritional status Physical activity Advanced directives List of other physicians Hospitalizations, surgeries, and ER visits in previous 12 months Vitals Screenings to include cognitive, depression, and falls Referrals and appointments  In addition, I have reviewed and discussed with patient certain preventive protocols, quality metrics, and best practice recommendations. A written personalized care plan for preventive services as  well as general preventive health recommendations were provided to patient.     Yevette Edwards, [place]   12/24/2020   Nurse Notes: Non face to face, 60 minutes  Mr. Stenberg , Thank you for taking time to come for your Medicare Wellness Visit. I appreciate your ongoing commitment to your health goals. Please review the following plan we discussed and let me know if I can assist you in the future.   These are the goals we discussed:  Goals   None     This is a list of the screening recommended for you and due dates:  Health Maintenance  Topic Date Due   Zoster (Shingles) Vaccine (1 of 2) Never done   COVID-19 Vaccine (4 - Booster for Moderna series) 06/17/2020   Flu Shot  11/21/2020   Colon Cancer Screening  10/09/2021   Tetanus Vaccine  03/04/2027   Hepatitis C Screening: USPSTF Recommendation to screen - Ages 18-79 yo.  Completed   Pneumonia vaccines  Completed   HPV Vaccine  Aged Out

## 2021-01-13 ENCOUNTER — Encounter: Payer: Self-pay | Admitting: Family Medicine

## 2021-01-30 ENCOUNTER — Encounter: Payer: Self-pay | Admitting: Family Medicine

## 2021-02-05 ENCOUNTER — Encounter: Payer: Self-pay | Admitting: Family Medicine

## 2021-02-05 DIAGNOSIS — K7689 Other specified diseases of liver: Secondary | ICD-10-CM | POA: Diagnosis not present

## 2021-02-05 DIAGNOSIS — R109 Unspecified abdominal pain: Secondary | ICD-10-CM | POA: Diagnosis not present

## 2021-02-05 DIAGNOSIS — K5730 Diverticulosis of large intestine without perforation or abscess without bleeding: Secondary | ICD-10-CM | POA: Diagnosis not present

## 2021-02-05 DIAGNOSIS — K4090 Unilateral inguinal hernia, without obstruction or gangrene, not specified as recurrent: Secondary | ICD-10-CM | POA: Diagnosis not present

## 2021-02-05 DIAGNOSIS — Z87891 Personal history of nicotine dependence: Secondary | ICD-10-CM | POA: Diagnosis not present

## 2021-02-05 DIAGNOSIS — K449 Diaphragmatic hernia without obstruction or gangrene: Secondary | ICD-10-CM | POA: Diagnosis not present

## 2021-02-05 DIAGNOSIS — K5790 Diverticulosis of intestine, part unspecified, without perforation or abscess without bleeding: Secondary | ICD-10-CM | POA: Diagnosis not present

## 2021-02-06 NOTE — Telephone Encounter (Signed)
Nurses  Please set Sevon up for a follow-up visit with myself in 2 weeks.  It would be fine to go ahead and initiate referral process for gastroenterology.  This can always be canceled later if his symptoms resolved.  More than likely it would take longer than 2 weeks to get in with gastroenterology.  It would be most advisable for the patient to follow-up with myself in 2 weeks.  He is currently on a trip-I would recommend setting him up for October 31 which would be 2 weeks from now.  You may also forward the message to [place].  Hi Neo Thank you for the update.  Glad the process went well.  We will see you in 2 weeks.  I would recommend continuing the Prilosec for now and we can discuss where to go from there based on how you are doing at the time your follow-up.  Thanks-Vernon

## 2021-02-06 NOTE — Addendum Note (Signed)
Addended by: Vicente Males on: 02/06/2021 09:36 AM   Modules accepted: Orders

## 2021-02-08 ENCOUNTER — Encounter: Payer: Self-pay | Admitting: Internal Medicine

## 2021-02-09 ENCOUNTER — Encounter: Payer: Self-pay | Admitting: Family Medicine

## 2021-02-09 ENCOUNTER — Telehealth: Payer: Self-pay | Admitting: Family Medicine

## 2021-02-09 NOTE — Telephone Encounter (Signed)
Patient stated that Dylan Chapman called him to schedule a two week follow up. Dr. Bary Leriche schedule is full. Please advise.   [phone]

## 2021-02-12 IMAGING — DX DG WRIST COMPLETE 3+V*L*
3 series · 3 of 3 positions shown · non-contrast
Comparison: None.

CLINICAL DATA: Initial evaluation for acute trauma, fall, wrist
pain.

EXAM:
LEFT WRIST - COMPLETE 3+ VIEW

[wrist pa]
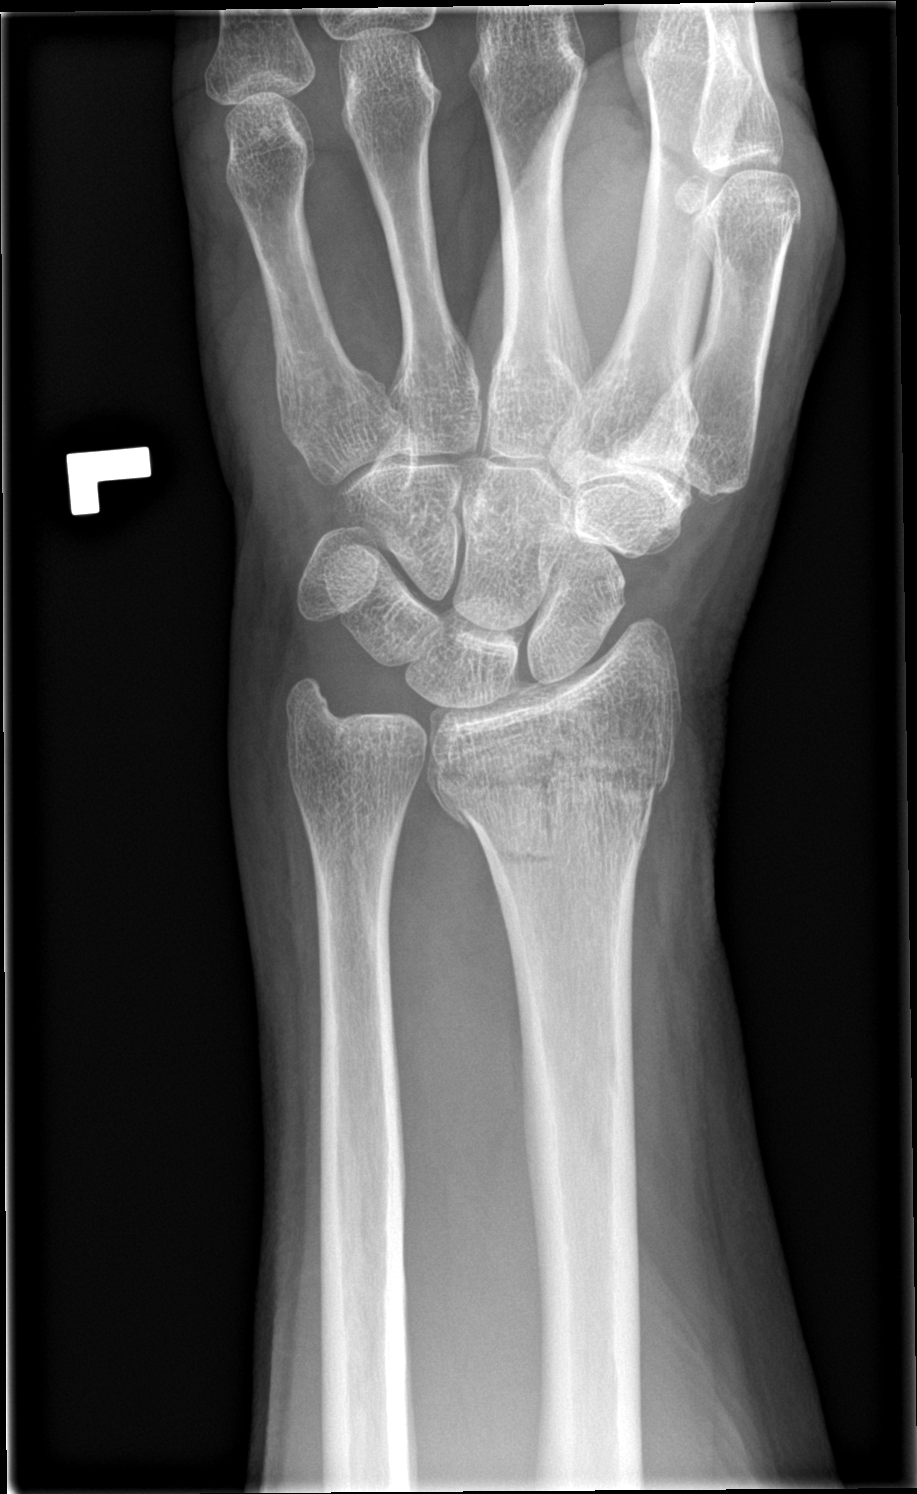

[wrist obl]
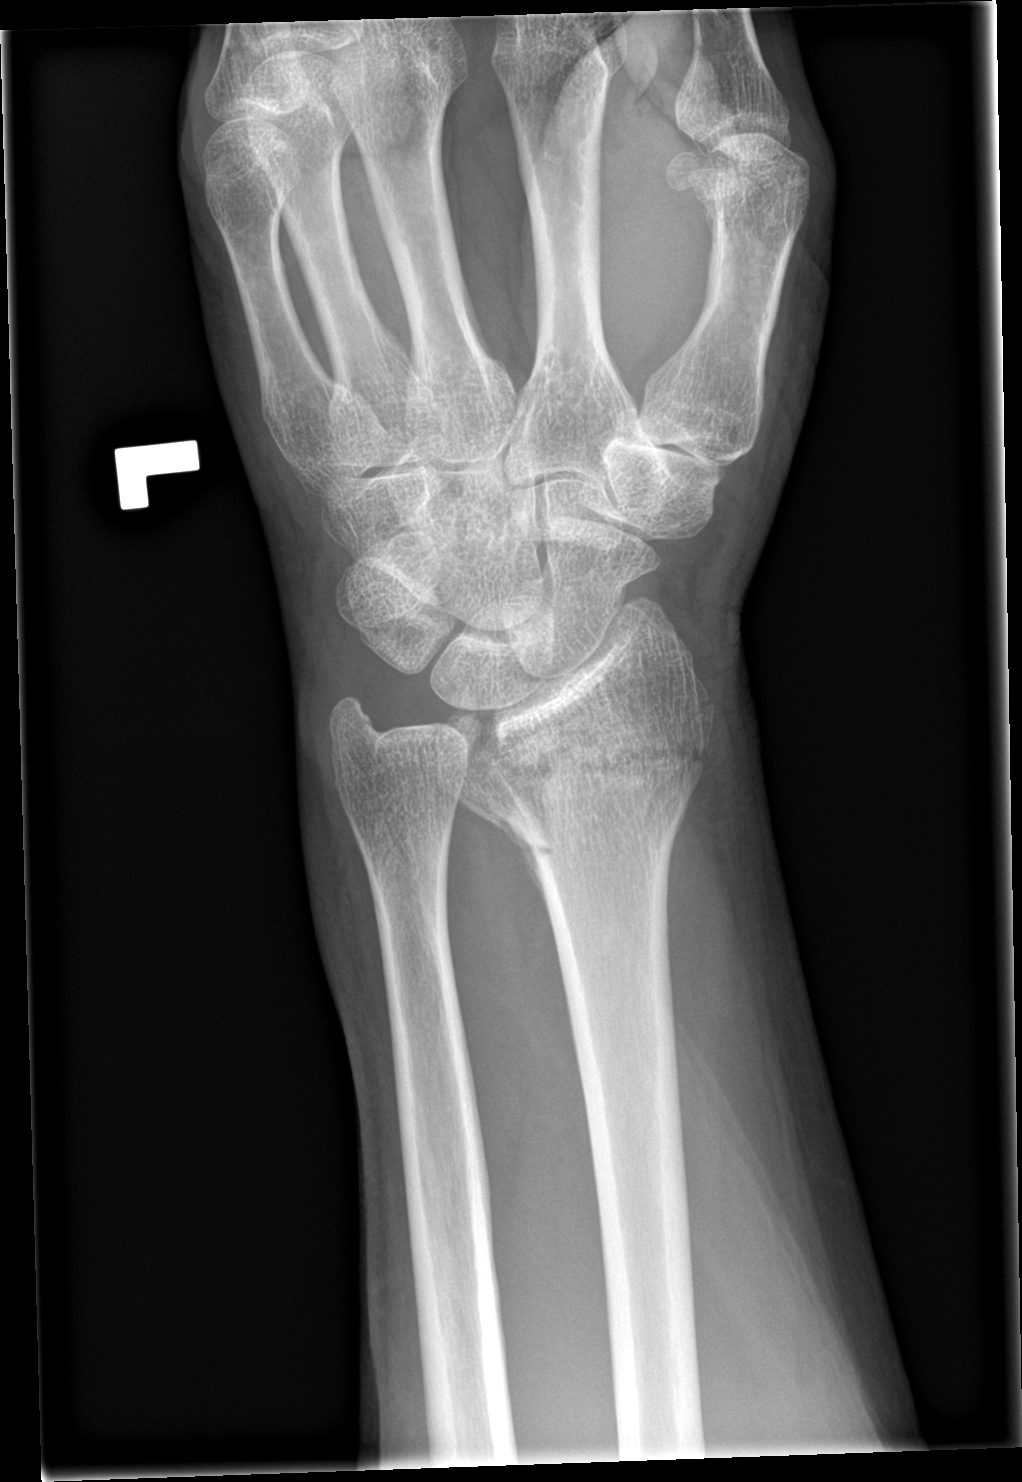

[wrist lat]
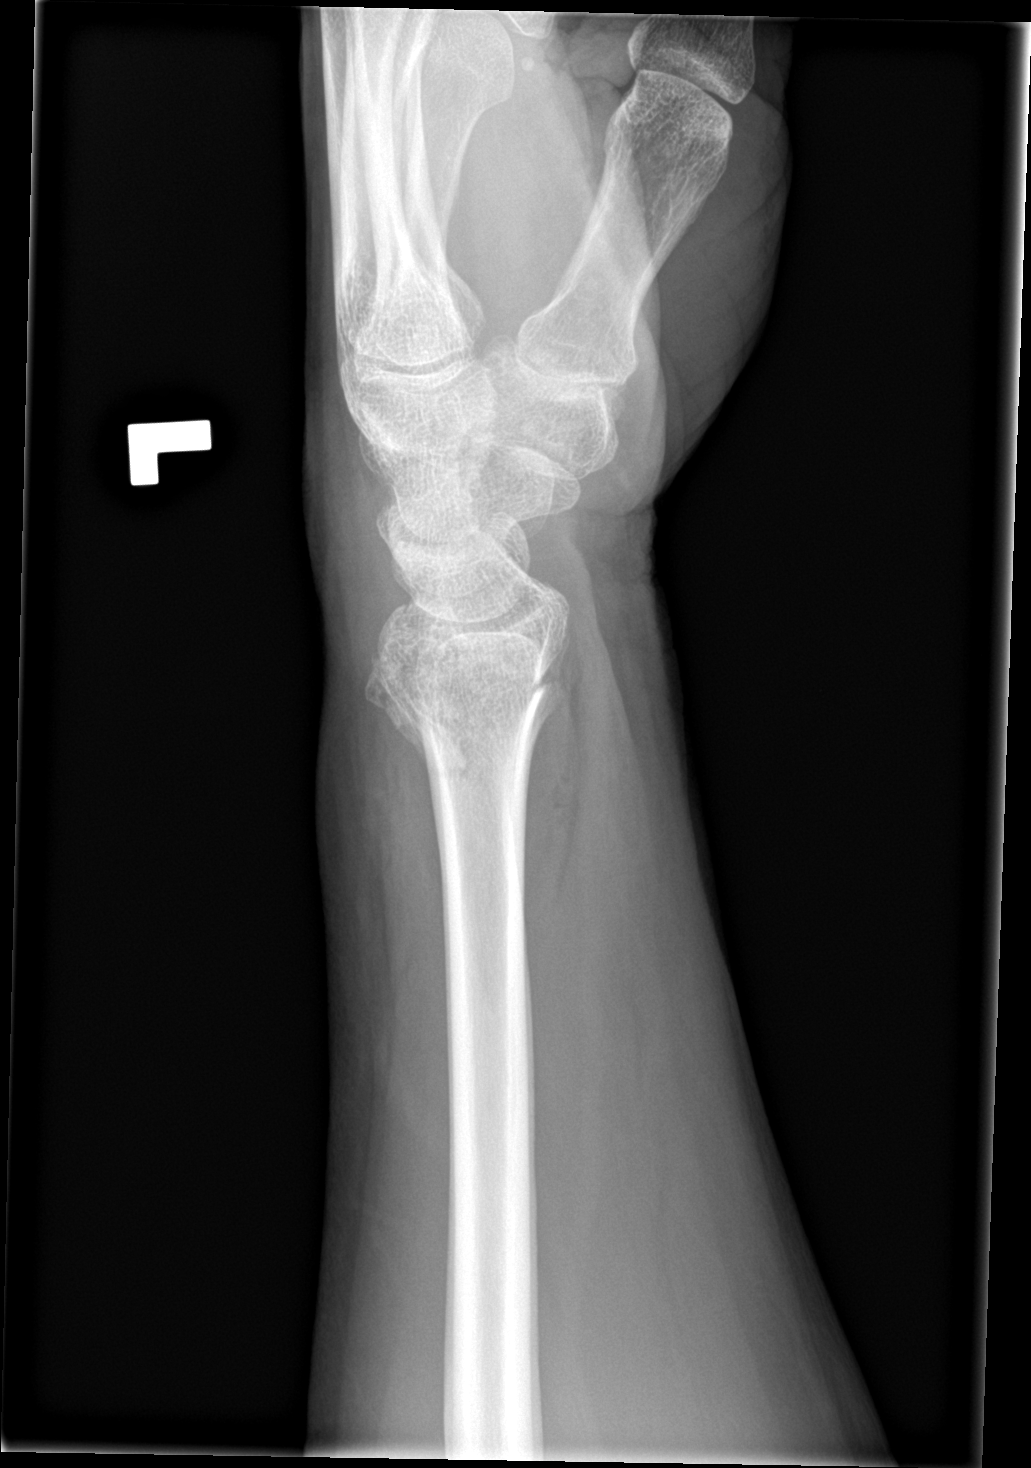

[3 of 3 positions shown; findings below may reference images not displayed]

FINDINGS: There is an acute comminuted fracture of the distal left radius with
associated intra-articular extension and mild displacement. Ulna
intact. No other acute osseous abnormality about the wrist. Diffuse
soft tissue swelling noted.
IMPRESSION: Acute comminuted intra-articular fracture of the distal left radius
with mild displacement.

## 2021-02-13 NOTE — Telephone Encounter (Signed)
Pt not due back to see provider until around 02/20/21; may have appt for next Friday. Thank you

## 2021-02-13 NOTE — Telephone Encounter (Signed)
May have a late afternoon appointment if open at 410 or [phone].  Or Friday afternoon on open slots otherwise he will be next week

## 2021-02-14 NOTE — Telephone Encounter (Signed)
I was able to review over the documentation from the ER in [place] including x-rays lab work and doctor's note  Please help set up [place] for a follow-up visit on Friday, if that does not work for his schedule early next week would be fine thank you

## 2021-02-15 ENCOUNTER — Encounter: Payer: Self-pay | Admitting: Family Medicine

## 2021-02-16 IMAGING — CT CT 3D INDEPENDENT WKST
1 series · 12 of 14 positions shown, 15 images · non-contrast
Comparison: Wrist radiographs 05/24/2019

CLINICAL DATA: Distal radial fracture.

EXAM:
CT OF THE LEFT WRIST WITHOUT CONTRAST
TECHNIQUE: Multidetector CT imaging was performed according to the standard
protocol. Multiplanar CT image reconstructions were also generated.

[Series 3: ext-soft · axial · 0.21mm/px · z∈[-166,-48]mm · 12 of 71 slices shown, 15 images]
[im 6/71  soft-tissue]
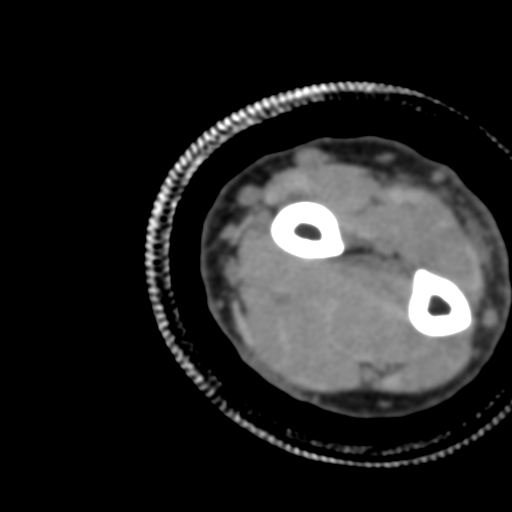
[im 6/71  bone]
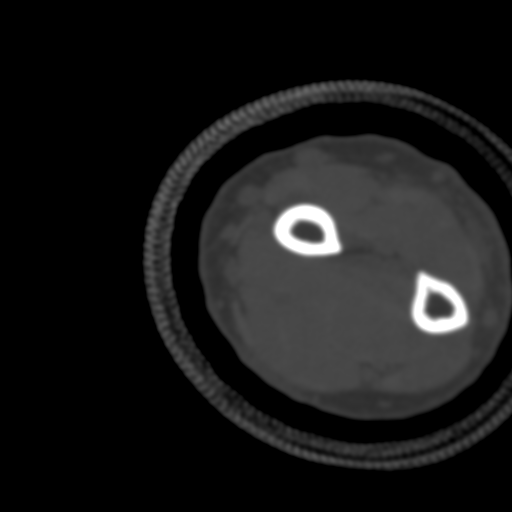
[im 11/71  bone]
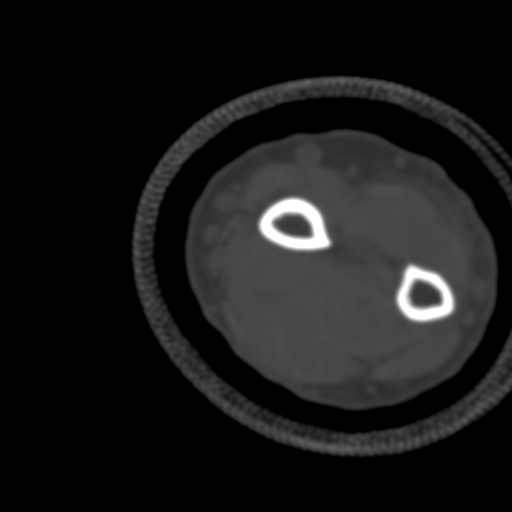
[im 17/71  bone]
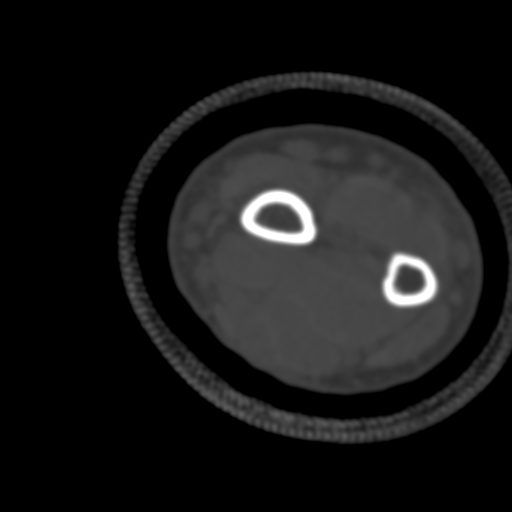
[im 22/71  bone]
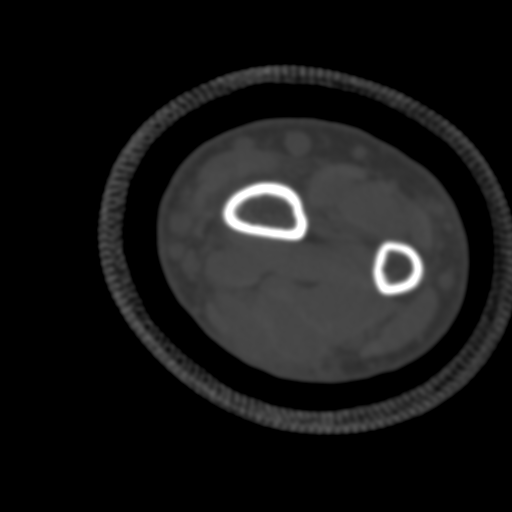
[im 27/71  soft-tissue]
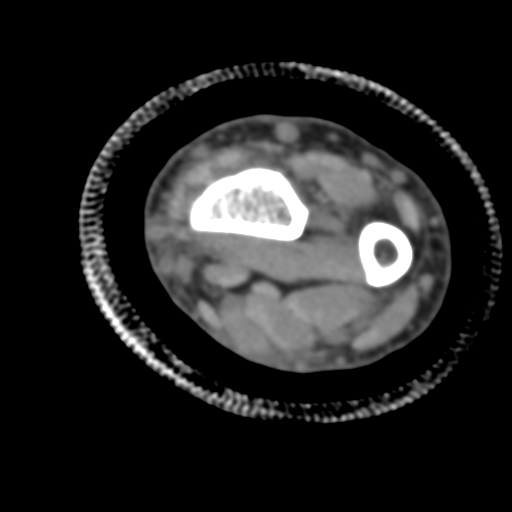
[im 27/71  bone]
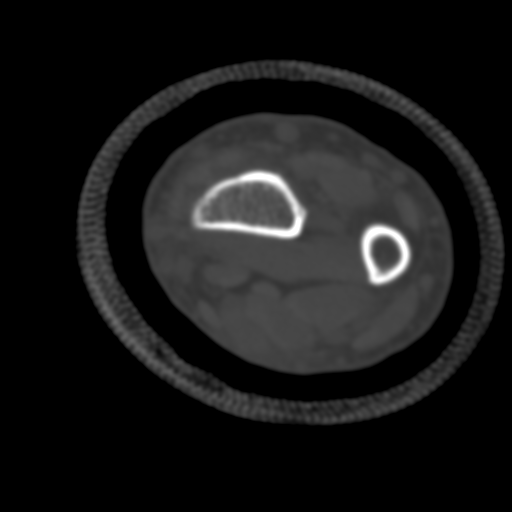
[im 33/71  bone]
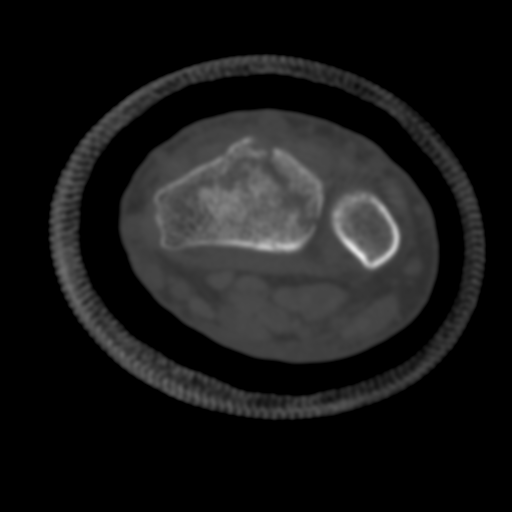
[im 38/71  bone]
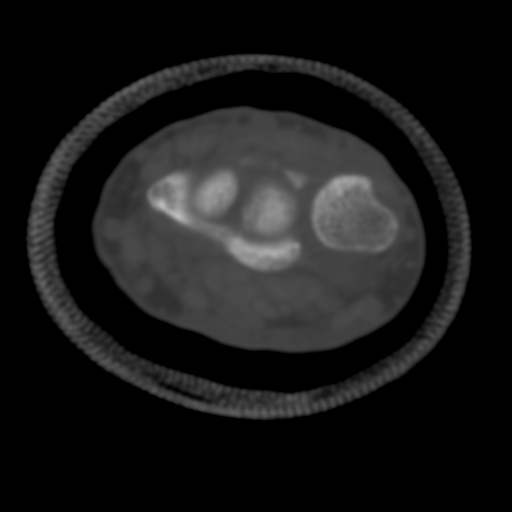
[im 44/71  bone]
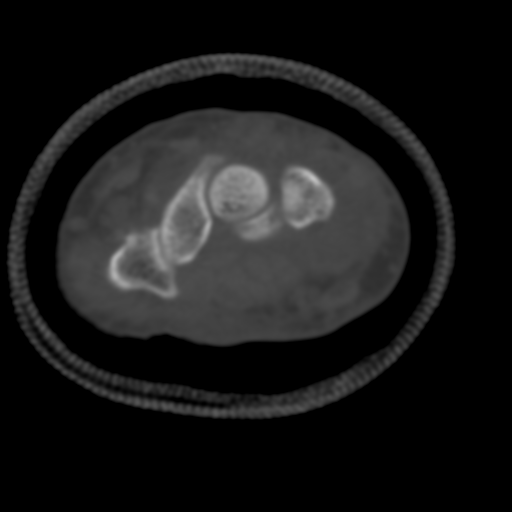
[im 49/71  soft-tissue]
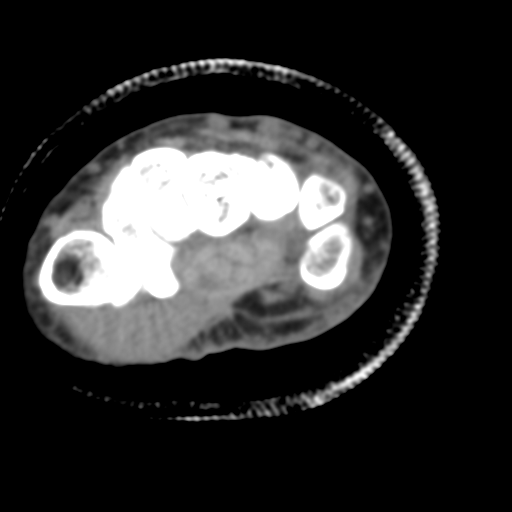
[im 49/71  bone]
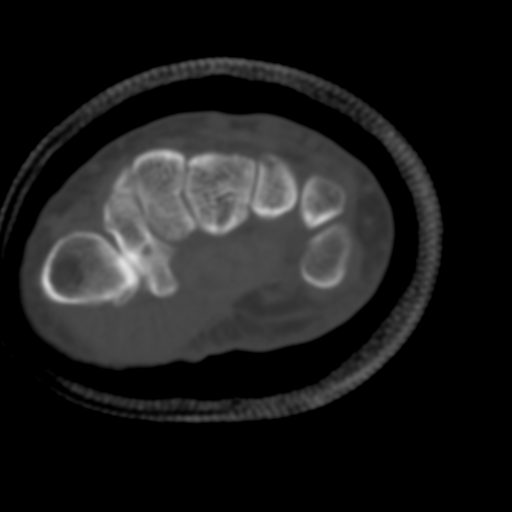
[im 54/71  bone]
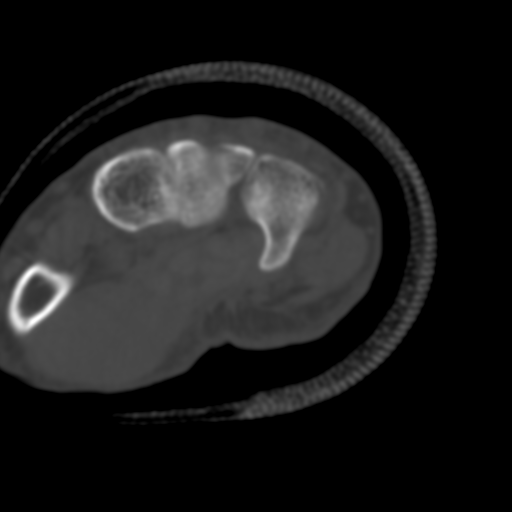
[im 60/71  bone]
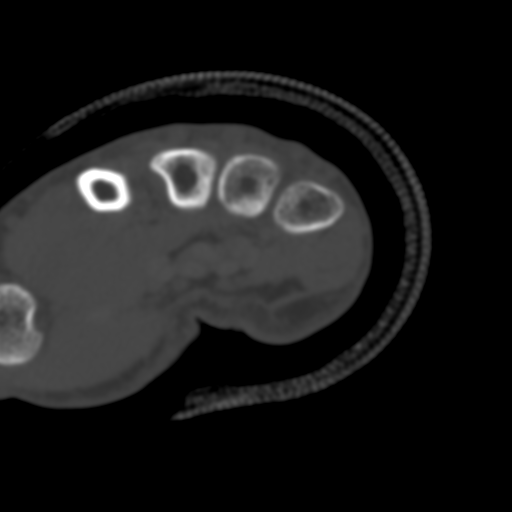
[im 65/71  bone]
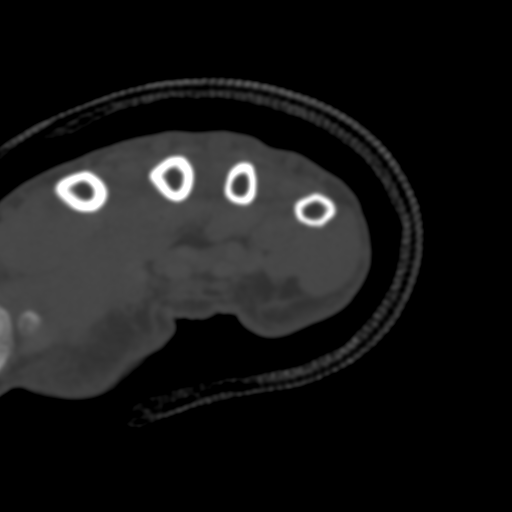

[12 of 14 positions shown; findings below may reference images not displayed]

FINDINGS: Bones/Joint/Cartilage

Fracture of the distal radius noted with transverse metaphyseal
component, and with a coronally oriented dorsal fracture plane
extending from the transverse fracture plane from Lister's tubercle
to the ulnar side of the radiocarpal joint as shown for example on
image [DATE]. These fracture planes also intersect into the radial
margin of the distal radioulnar joint. At the distal radial
articular surface, there is up to 0.2 cm of step-off shown on image
[DATE], and also about 2 mm of distraction as on image [DATE].

No additional fractures are identified. There is some spurring loss
of articular space at the first carpometacarpal articulation
compatible with osteoarthritis.

Ligaments

Suboptimally assessed by CT.

Muscles and Tendons

No extensor tendon entrapment is identified.

Soft tissues

Subcutaneous edema along the volar and dorsal wrist.
IMPRESSION: 1. Comminuted intra-articular fracture of the distal radius, with a
transverse metaphyseal component and a coronally oriented dorsal
fracture plane extending from the Lister's tubercle to the ulnar
side of the radiocarpal joint. The fracture planes also intersect
into the radial side of the distal radioulnar joint. There is up to
0.2 cm of step-off at the distal radial articular surface.
2. Subcutaneous edema along the volar and dorsal wrist.
3. Osteoarthritis at the first carpometacarpal articulation.

## 2021-02-17 ENCOUNTER — Ambulatory Visit (INDEPENDENT_AMBULATORY_CARE_PROVIDER_SITE_OTHER): Payer: Medicare HMO | Admitting: Family Medicine

## 2021-02-17 ENCOUNTER — Other Ambulatory Visit: Payer: Self-pay

## 2021-02-17 VITALS — BP 129/81 | Ht 66.0 in | Wt 164.8 lb

## 2021-02-17 DIAGNOSIS — K4090 Unilateral inguinal hernia, without obstruction or gangrene, not specified as recurrent: Secondary | ICD-10-CM

## 2021-02-17 DIAGNOSIS — K2900 Acute gastritis without bleeding: Secondary | ICD-10-CM | POA: Diagnosis not present

## 2021-02-17 DIAGNOSIS — K625 Hemorrhage of anus and rectum: Secondary | ICD-10-CM | POA: Diagnosis not present

## 2021-02-17 DIAGNOSIS — N433 Hydrocele, unspecified: Secondary | ICD-10-CM

## 2021-02-17 DIAGNOSIS — K5790 Diverticulosis of intestine, part unspecified, without perforation or abscess without bleeding: Secondary | ICD-10-CM

## 2021-02-17 DIAGNOSIS — K449 Diaphragmatic hernia without obstruction or gangrene: Secondary | ICD-10-CM

## 2021-02-17 MED ORDER — PANTOPRAZOLE SODIUM 40 MG PO TBEC: 40.0000 mg | DELAYED_RELEASE_TABLET | Freq: Every day | ORAL | 3 refills | Status: DC

## 2021-02-17 NOTE — Progress Notes (Signed)
   Subjective:    Patient ID: Dylan Chapman, male    DOB: March 24, 1951, 70 y.o.   MRN: [id number]  HPI A few weeks heading into the episode with increase stress Taking occas tums  Patient arrives for a follow up from recent ER visit for abdominal pain. See my chart message as follows below  1.  Do I continue to take prilosec (or switch to omeprazole, like I was on some years ago), or stop taking it in hopes that the problem was temporary. 2.  'Extensive diverticulosis'  I need a good list of foods to avoid.  What's the prognosis? 3.  I have an appt set for March 14th with Marylee Floras regarding the abdominal pain.  Should we add to this visit, or go ahead and schedule my next colonoscopy.  I think I am due next year. 4.  Have you been able to see the CT Scan? 5.  Is the right inguinal hernia a 'no problem'? 6.  Lung:  atelectasis & small sliding hiatal hernia?  Explain these. 7.  Small bilateral hydroceles??  Explain 8.  Minimally dilated veins in the right scrotum?  Explain   That covers all the things in the printout I received from the ER October 16th. My abdominal pain has been gone, within 2 days of taking the prilosec.  I will say, that I have had to take some antacids (OTC) on occasion over the last few weeks.  Could this, in part, be due to all the anxieties and issues of caring for, and losing, Juliann Pulse? Review of Systems     Objective:   Physical Exam  Lungs clear heart regular abdomen soft no guarding rebound      Assessment & Plan:  1. Diverticulosis This was a finding on his CAT scan plus also it was present on his last colonoscopy no need for any dietary measures other than fiber and drink plenty of liquids.  2. Acute gastritis without hemorrhage, unspecified gastritis type PPI recommended switch over to pantoprazole.  Patient will continue this for at least 8 to 12 weeks.  If doing well at the end of this then more than likely switch over to Pepcid on a as needed  basis.  Could well be related to the stress of the loss of his wife from cancer  I do not feel the patient needs EGD currently but with his age it is possible that gastroenterology may recommend this  3. Blood per rectum He states he has internal hemorrhoids and occasionally when he is walking he will have a small amount of blood that will show up in his underwear.  He is due for colonoscopy next year but I think it would be a good idea for him to move this up.  We will discuss this with gastroenterology  4. Hiatal hernia Small sliding hiatal hernia noted on his CAT scan done in [place] not inconsequential currently we did discuss this finding  5. Inguinal hernia without obstruction or gangrene, recurrence not specified, unspecified laterality Fat-containing we did discuss his findings no need for surgery  6. Hydrocele, unspecified hydrocele type We did discuss finding no need for surgery  We will send communication to gastroenterology await their reply

## 2021-02-20 NOTE — Progress Notes (Signed)
02/20/21 Message sent to [facility]

## 2021-02-20 NOTE — Progress Notes (Signed)
Mutual patient-due for colonoscopy next year recently was in ER in [place] with severe epigastric pain most likely gastritis doing better now on PPI May need EGD?  Because of his age.  More importantly he relates he is due for colonoscopy early next year but he also states he has a history of internal hemorrhoids and at times when he is walking there is a small amount of blood per rectum.  Given that he is having this finding I believe it would be wise to see him sooner than March which he is currently scheduled for thank you-your thoughts?  Thank you-Lars

## 2021-02-27 ENCOUNTER — Telehealth: Payer: Self-pay | Admitting: Gastroenterology

## 2021-02-27 NOTE — Telephone Encounter (Signed)
     Stacey: received message from Dr. Wolfgang Phoenix regarding patient. History of epigastric pain recently but better on PPI. Also with rectal bleeding. Please have him put on cancellation list and arrange sooner appointment if possible with any APP.

## 2021-02-28 NOTE — Progress Notes (Signed)
Per Dr.Boone We will get him in sooner. Pt has been added to their cancellation list

## 2021-03-17 ENCOUNTER — Other Ambulatory Visit: Payer: Self-pay | Admitting: Family Medicine

## 2021-03-21 ENCOUNTER — Other Ambulatory Visit: Payer: Self-pay | Admitting: Family Medicine

## 2021-03-21 NOTE — Telephone Encounter (Signed)
May have 3 refills 

## 2021-03-29 DIAGNOSIS — L814 Other melanin hyperpigmentation: Secondary | ICD-10-CM | POA: Diagnosis not present

## 2021-03-29 DIAGNOSIS — L821 Other seborrheic keratosis: Secondary | ICD-10-CM | POA: Diagnosis not present

## 2021-03-29 DIAGNOSIS — L570 Actinic keratosis: Secondary | ICD-10-CM | POA: Diagnosis not present

## 2021-03-29 DIAGNOSIS — D225 Melanocytic nevi of trunk: Secondary | ICD-10-CM | POA: Diagnosis not present

## 2021-03-29 DIAGNOSIS — L918 Other hypertrophic disorders of the skin: Secondary | ICD-10-CM | POA: Diagnosis not present

## 2021-04-25 ENCOUNTER — Ambulatory Visit (INDEPENDENT_AMBULATORY_CARE_PROVIDER_SITE_OTHER): Payer: Medicare HMO | Admitting: Family Medicine

## 2021-04-25 ENCOUNTER — Other Ambulatory Visit: Payer: Self-pay

## 2021-04-25 VITALS — BP 124/74 | HR 63 | Temp 98.1°F | Ht 66.0 in | Wt 160.6 lb

## 2021-04-25 DIAGNOSIS — E038 Other specified hypothyroidism: Secondary | ICD-10-CM | POA: Diagnosis not present

## 2021-04-25 DIAGNOSIS — G8929 Other chronic pain: Secondary | ICD-10-CM

## 2021-04-25 DIAGNOSIS — Z125 Encounter for screening for malignant neoplasm of prostate: Secondary | ICD-10-CM

## 2021-04-25 DIAGNOSIS — Z79899 Other long term (current) drug therapy: Secondary | ICD-10-CM

## 2021-04-25 DIAGNOSIS — R519 Headache, unspecified: Secondary | ICD-10-CM | POA: Diagnosis not present

## 2021-04-25 DIAGNOSIS — K219 Gastro-esophageal reflux disease without esophagitis: Secondary | ICD-10-CM | POA: Diagnosis not present

## 2021-04-25 DIAGNOSIS — E785 Hyperlipidemia, unspecified: Secondary | ICD-10-CM

## 2021-04-25 NOTE — Patient Instructions (Signed)
Please do your lab work before your wellness exam  We will send communication to gastroenterology regarding colonoscopy situation  We will be setting up appointment with neurologist in [place] for headaches and potential Botox injections  We will be setting up the MRI.  As for the Protonix I recommend utilizing it every other day for the next 2 weeks.  If you were no longer having reflux symptoms at that point you may stop the medication.  Should your symptoms recur it would be reasonable to restart the medicine.  Either way send [place] an update in a few weeks time how things are going.  If further questions concerns or issues please send a MyChart message.  Thanks-Bhavik

## 2021-04-25 NOTE — Progress Notes (Signed)
° °  Subjective:    Patient ID: Dylan Chapman, male    DOB: November 29, 1950, 71 y.o.   MRN: [id number]  HPI Follow up on reflux. Patient states he is doing really well. Patient denies any significant reflux States overall energy level improving Exercising dietary going well No reoccurrence of diverticulitis Frequent headaches Having nausea with some No vomiting Some of these headaches wake him up in the middle the night Went through the loss of his wife earlier last year but is working his way through this   Review of Systems     Objective:   Physical Exam  General-in no acute distress Eyes-no discharge Lungs-respiratory rate normal, CTA CV-no murmurs,RRR Extremities skin warm dry no edema Neuro grossly normal Behavior normal, alert       Assessment & Plan:  1. Chronic intractable headache, unspecified headache type Frequent headaches has at least 15 days worth of headaches.  Sometimes headaches very severe.  Sometimes wake him up at 2 or 3 in the morning.  Because of this recommend MRI of the brain Patient states he has tried multiple medicines in the past which did not seem to help He currently takes a headache compound that does have caffeine in it and may have a component of overuse analgesia headache Would benefit from neurology consult for possibility of other medications versus injections to help his headaches - MR Brain Wo Contrast - Ambulatory referral to Neurology  2. Subclinical hypothyroidism Recheck TSH.  Currently does not need medication  3. Hyperlipidemia, unspecified hyperlipidemia type Healthy diet check lab work await results - Lipid Profile  4. Screening PSA (prostate specific antigen) Has had elevated PSA in the past but urology did further work-up came back normal - PSA  5. Gastroesophageal reflux disease without esophagitis Under decent control with PPI continue this  6. High risk medication use See labs - Hepatic function panel - Basic  Metabolic Panel (BMET)

## 2021-04-28 DIAGNOSIS — Z79899 Other long term (current) drug therapy: Secondary | ICD-10-CM | POA: Diagnosis not present

## 2021-04-28 DIAGNOSIS — E785 Hyperlipidemia, unspecified: Secondary | ICD-10-CM | POA: Diagnosis not present

## 2021-04-28 DIAGNOSIS — E038 Other specified hypothyroidism: Secondary | ICD-10-CM | POA: Diagnosis not present

## 2021-04-28 DIAGNOSIS — Z125 Encounter for screening for malignant neoplasm of prostate: Secondary | ICD-10-CM | POA: Diagnosis not present

## 2021-04-28 DIAGNOSIS — K219 Gastro-esophageal reflux disease without esophagitis: Secondary | ICD-10-CM | POA: Diagnosis not present

## 2021-04-29 LAB — BASIC METABOLIC PANEL
BUN/Creatinine Ratio: 13 (ref 10–24)
BUN: 18 mg/dL (ref 8–27)
CO2: 26 mmol/L (ref 20–29)
Calcium: 9.7 mg/dL (ref 8.6–10.2)
Chloride: 103 mmol/L (ref 96–106)
Creatinine, Ser: 1.37 mg/dL — ABNORMAL HIGH (ref 0.76–1.27)
Glucose: 90 mg/dL (ref 70–99)
Potassium: 4.5 mmol/L (ref 3.5–5.2)
Sodium: 141 mmol/L (ref 134–144)
eGFR: 56 mL/min/{1.73_m2} — ABNORMAL LOW (ref >59)

## 2021-04-29 LAB — LIPID PANEL
Chol/HDL Ratio: 2.9 ratio (ref 0.0–5.0)
Cholesterol, Total: 168 mg/dL (ref 100–199)
HDL: 58 mg/dL (ref >39)
LDL Chol Calc (NIH): 99 mg/dL (ref 0–99)
Triglycerides: 57 mg/dL (ref 0–149)
VLDL Cholesterol Cal: 11 mg/dL (ref 5–40)

## 2021-04-29 LAB — HEPATIC FUNCTION PANEL
ALT: 14 IU/L (ref 0–44)
AST: 20 IU/L (ref 0–40)
Albumin: 4.5 g/dL (ref 3.8–4.8)
Alkaline Phosphatase: 90 IU/L (ref 44–121)
Bilirubin Total: 0.7 mg/dL (ref 0.0–1.2)
Bilirubin, Direct: 0.19 mg/dL (ref 0.00–0.40)
Total Protein: 6.6 g/dL (ref 6.0–8.5)

## 2021-04-29 LAB — TSH: TSH: 4.96 u[IU]/mL — ABNORMAL HIGH (ref 0.450–4.500)

## 2021-04-29 LAB — PSA: Prostate Specific Ag, Serum: 2.9 ng/mL (ref 0.0–4.0)

## 2021-05-01 ENCOUNTER — Other Ambulatory Visit: Payer: Self-pay

## 2021-05-01 DIAGNOSIS — Z79899 Other long term (current) drug therapy: Secondary | ICD-10-CM

## 2021-05-01 DIAGNOSIS — E038 Other specified hypothyroidism: Secondary | ICD-10-CM

## 2021-05-03 ENCOUNTER — Telehealth: Payer: Self-pay | Admitting: Family Medicine

## 2021-05-03 NOTE — Telephone Encounter (Signed)
Patient advised per Dr [name]: Dr [name] did communicate with neurology.  They are interested in him doing a [place] questionnaire to see if there may be any underlying signs of sleep apnea.  This questionnaire could easily be completed when he follows up next week for wellness exam.  He has a MRI scheduled for tomorrow we will be watching for results and will discuss everything in detail next week when he comes for his wellness. Patient verbalized understanding.

## 2021-05-03 NOTE — Telephone Encounter (Signed)
Please let the patient know-Dylan Chapman I did communicate with neurology.  They are interested in him doing a [place] questionnaire to see if there may be any underlying signs of sleep apnea.  This questionnaire could easily be completed when he follows up next week for wellness exam.  He has a MRI scheduled for tomorrow we will be watching for results and will discuss everything in detail next week when he comes for his wellness.  Thanks

## 2021-05-04 ENCOUNTER — Other Ambulatory Visit: Payer: Self-pay

## 2021-05-04 ENCOUNTER — Ambulatory Visit (HOSPITAL_COMMUNITY)
Admission: RE | Admit: 2021-05-04 | Discharge: 2021-05-04 | Disposition: A | Discharge status: Home / Self Care | Payer: Medicare HMO | Source: Ambulatory Visit | Attending: Family Medicine | Admitting: Family Medicine

## 2021-05-04 DIAGNOSIS — R519 Headache, unspecified: Secondary | ICD-10-CM | POA: Insufficient documentation

## 2021-05-04 DIAGNOSIS — G8929 Other chronic pain: Secondary | ICD-10-CM | POA: Insufficient documentation

## 2021-05-09 ENCOUNTER — Encounter: Payer: Self-pay | Admitting: Family Medicine

## 2021-05-09 ENCOUNTER — Ambulatory Visit (INDEPENDENT_AMBULATORY_CARE_PROVIDER_SITE_OTHER): Payer: Medicare HMO | Admitting: Family Medicine

## 2021-05-09 ENCOUNTER — Other Ambulatory Visit: Payer: Self-pay

## 2021-05-09 VITALS — BP 126/76 | HR 65 | Temp 97.4°F | Ht 66.0 in | Wt 159.0 lb

## 2021-05-09 DIAGNOSIS — E038 Other specified hypothyroidism: Secondary | ICD-10-CM

## 2021-05-09 DIAGNOSIS — Z0001 Encounter for general adult medical examination with abnormal findings: Secondary | ICD-10-CM

## 2021-05-09 DIAGNOSIS — R7989 Other specified abnormal findings of blood chemistry: Secondary | ICD-10-CM | POA: Diagnosis not present

## 2021-05-09 DIAGNOSIS — B351 Tinea unguium: Secondary | ICD-10-CM

## 2021-05-09 DIAGNOSIS — R519 Headache, unspecified: Secondary | ICD-10-CM | POA: Diagnosis not present

## 2021-05-09 DIAGNOSIS — G8929 Other chronic pain: Secondary | ICD-10-CM

## 2021-05-09 DIAGNOSIS — I77811 Abdominal aortic ectasia: Secondary | ICD-10-CM | POA: Diagnosis not present

## 2021-05-09 DIAGNOSIS — Z0000 Encounter for general adult medical examination without abnormal findings: Secondary | ICD-10-CM

## 2021-05-09 NOTE — Progress Notes (Signed)
Subjective:    Patient ID: Dylan Chapman, male    DOB: Sep 04, 1950, 71 y.o.   MRN: [id number]  HPI Very nice patient  AWV- Annual Wellness Visit Patient lost his wife several months ago he is processing through this well He is staying physically active exercising several times a week Trying to eat healthy for the most part Gets adequate sleep Stress levels reasonable  The patient was seen for their annual wellness visit. The patient's past medical history, surgical history, and family history were reviewed. Pertinent vaccines were reviewed ( tetanus, pneumonia, shingles, flu) The patient's medication list was reviewed and updated.  The height and weight were entered.  BMI recorded in electronic record elsewhere  Cognitive screening was completed. Outcome of Mini - Cog: 5   Falls /depression screening electronically recorded within record elsewhere  Current tobacco usage:no (All patients who use tobacco were given written and verbal information on quitting)  Recent listing of emergency department/hospitalizations over the past year were reviewed.  current specialist the patient sees on a regular basis: neuro consult in feb 2023, gastro consult in march 2023   Medicare annual wellness visit patient questionnaire was reviewed.  A written screening schedule for the patient for the next 5-10 years was given. Appropriate discussion of followup regarding next visit was discussed.   MRI results for MRI, toenail fungus medication request  Review of Systems     Objective:   Physical Exam  General-in no acute distress Eyes-no discharge Lungs-respiratory rate normal, CTA CV-no murmurs,RRR Extremities skin warm dry no edema Neuro grossly normal Behavior normal, alert Prostate exam normal GU normal extremities no edema  Toenail fungus noted on the left foot on 2 toes    Assessment & Plan:  1. Encounter for subsequent annual wellness visit (AWV) in Medicare patient Adult  wellness-complete.wellness physical was conducted today. Importance of diet and exercise were discussed in detail.  In addition to this a discussion regarding safety was also covered. We also reviewed over immunizations and gave recommendations regarding current immunization needed for age.  In addition to this additional areas were also touched on including: Preventative health exams needed:  Colonoscopy will be seeing gastroenterology in March possibly may be doing the colonoscopy this spring  Patient was advised yearly wellness exam   2. Subclinical hypothyroidism Will monitor TSH at least every 6 months.  Currently right now does not need medication  3. Toenail fungus Patient has taken Lamisil before he understands the importance of checking liver function he will check lab work in 4 to 6 weeks  4. Chronic intractable headache, unspecified headache type He will be seeing neurology in the near future Creatinine was elevated we will be rechecking kidney function again in 4 to 6 weeks midday with urine ACR It is important to get away from anti-inflammatories.  Currently he is taking a headache concoction he has been on for years that includes ketoprofen as well as caffeine he is thinking about switching over to one that has acetaminophen and caffeine he takes this approximately 2-3 times per week  His MRI showed some small vessel ischemic changes but no serious underlying issue   No tumor no growth 5. Ectatic abdominal aorta (HCC) Patient will need a follow-up ultrasound of the aorta it has been 5 years and it was recommended at that time  6. Well adult exam See per above healthy diet recommended regular activity recommended  Bump up dose of statin to help get LDL below 70 to prevent  future vascular issues  Elevated creatinine check metabolic 7 urine ACR midday in approximately 4 to 6 weeks  We will recheck lipid profile in approximately 4 to 6 weeks

## 2021-05-10 ENCOUNTER — Other Ambulatory Visit: Payer: Self-pay | Admitting: Family Medicine

## 2021-05-10 ENCOUNTER — Telehealth: Payer: Self-pay | Admitting: Family Medicine

## 2021-05-10 MED ORDER — ATORVASTATIN CALCIUM 80 MG PO TABS: 80.0000 mg | ORAL_TABLET | Freq: Every day | ORAL | 3 refills | Status: DC

## 2021-05-10 MED ORDER — TERBINAFINE HCL 250 MG PO TABS: 250.0000 mg | ORAL_TABLET | Freq: Every day | ORAL | 2 refills | Status: DC

## 2021-05-10 NOTE — Addendum Note (Signed)
Addended by: Sallee Lange A on: 05/10/2021 06:21 PM   Modules accepted: Orders

## 2021-05-10 NOTE — Telephone Encounter (Signed)
Patient was checking the status of his new prescription of lipitor 80 mg that was discussed yesterday. He went to [facility] in [place] and they don't have the new script.  CB# [phone]

## 2021-05-10 NOTE — Telephone Encounter (Signed)
Nurses I did send a message regarding this patient via chart messaging.  In regards to the lab work to be ordered the next 7 in the liver Met 7 is due to elevated creatinine Liver is due to high risk medicine Thank you

## 2021-05-10 NOTE — Telephone Encounter (Signed)
Patient informed of md message. Verbalized understanding. ?

## 2021-05-10 NOTE — Telephone Encounter (Signed)
New dose sent in today Sorry for the delay Behind on charting

## 2021-05-11 ENCOUNTER — Encounter: Payer: Self-pay | Admitting: Family Medicine

## 2021-05-12 ENCOUNTER — Other Ambulatory Visit: Payer: Self-pay

## 2021-05-12 DIAGNOSIS — Z79899 Other long term (current) drug therapy: Secondary | ICD-10-CM

## 2021-05-12 NOTE — Telephone Encounter (Addendum)
-----  Message from Kathyrn Drown, MD sent at 05/10/2021  6:21 PM EST ----- Nurses-please talk with patient-repeat met 7, liver, in approximately 4 to 6 weeks midday well-hydrated, I sent in his Lamisil 1 daily for the next 12 weeks Very important for him to repeat liver function in 4 to 6 weeks We will hold off on rechecking thyroid for 4 to 6 months Let him know that his Lamisil was sent in.  Unfortunately I was behind on charting unable to send it in directly at the time of his visit.  Thank you   ---- Patient made aware per provider notes--

## 2021-05-15 NOTE — Addendum Note (Signed)
Addended by: Vicente Males on: 05/15/2021 02:17 PM   Modules accepted: Orders

## 2021-05-29 ENCOUNTER — Encounter: Payer: Self-pay | Admitting: Psychiatry

## 2021-05-29 ENCOUNTER — Ambulatory Visit: Payer: Medicare HMO | Admitting: Psychiatry

## 2021-05-29 VITALS — Ht 67.0 in | Wt 161.0 lb

## 2021-05-29 DIAGNOSIS — G43019 Migraine without aura, intractable, without status migrainosus: Secondary | ICD-10-CM | POA: Diagnosis not present

## 2021-05-29 MED ORDER — QULIPTA 30 MG PO TABS: 30.0000 mg | ORAL_TABLET | Freq: Every day | ORAL | 3 refills | Status: DC
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
## 2021-05-29 NOTE — Patient Instructions (Signed)
Start Qulipta (atogepant) daily for headache prevention

## 2021-06-07 ENCOUNTER — Telehealth: Payer: Self-pay

## 2021-06-07 ENCOUNTER — Encounter: Payer: Self-pay | Admitting: Psychiatry

## 2021-06-07 NOTE — Telephone Encounter (Signed)
Received fax from [facility] requesting additonal information on pt's PA.  I have completed and faxed back to # [phone], confirmation received.

## 2021-06-07 NOTE — Telephone Encounter (Signed)
PA for qulipta has been sent via cmm Your information has been submitted to [place] [employer]. [employer] will review the request and will issue a decision, typically within 1-3 days from your submission. You can check the updated outcome later by reopening this request.  If [employer] has not responded in 1-3 days or if you have any questions about your ePA request, please contact [place] Medicare Part D at [phone]. If you think there may be a problem with your PA request, use our live chat feature at the bottom right.

## 2021-06-08 NOTE — Telephone Encounter (Signed)
Received denial on PA sent through cover my meds. I have submitted an appeal over the phone with [facility].  Ref # for appeal case is [id number]. Determination should be reached within 1 weeks time.

## 2021-06-14 ENCOUNTER — Other Ambulatory Visit: Payer: Self-pay | Admitting: *Deleted

## 2021-06-14 ENCOUNTER — Encounter: Payer: Self-pay | Admitting: Psychiatry

## 2021-06-14 DIAGNOSIS — K5790 Diverticulosis of intestine, part unspecified, without perforation or abscess without bleeding: Secondary | ICD-10-CM | POA: Diagnosis not present

## 2021-06-14 DIAGNOSIS — Z79899 Other long term (current) drug therapy: Secondary | ICD-10-CM | POA: Diagnosis not present

## 2021-06-14 DIAGNOSIS — K449 Diaphragmatic hernia without obstruction or gangrene: Secondary | ICD-10-CM | POA: Diagnosis not present

## 2021-06-14 DIAGNOSIS — E038 Other specified hypothyroidism: Secondary | ICD-10-CM

## 2021-06-14 NOTE — Telephone Encounter (Signed)
Appeal has been approved for qulipta.  Coverage dates 04/23/21-04/22/2022.  Pharmacy and pt have been notified of approval.

## 2021-06-15 LAB — HEPATIC FUNCTION PANEL
ALT: 20 IU/L (ref 0–44)
AST: 25 IU/L (ref 0–40)
Albumin: 4.6 g/dL (ref 3.8–4.8)
Alkaline Phosphatase: 86 IU/L (ref 44–121)
Bilirubin Total: 0.6 mg/dL (ref 0.0–1.2)
Bilirubin, Direct: 0.14 mg/dL (ref 0.00–0.40)
Total Protein: 6.9 g/dL (ref 6.0–8.5)

## 2021-06-15 LAB — BASIC METABOLIC PANEL
BUN/Creatinine Ratio: 18 (ref 10–24)
BUN: 21 mg/dL (ref 8–27)
CO2: 23 mmol/L (ref 20–29)
Calcium: 10.1 mg/dL (ref 8.6–10.2)
Chloride: 101 mmol/L (ref 96–106)
Creatinine, Ser: 1.15 mg/dL (ref 0.76–1.27)
Glucose: 89 mg/dL (ref 70–99)
Potassium: 4.1 mmol/L (ref 3.5–5.2)
Sodium: 141 mmol/L (ref 134–144)
eGFR: 69 mL/min/{1.73_m2} (ref >59)

## 2021-06-15 LAB — MICROALBUMIN / CREATININE URINE RATIO
Creatinine, Urine: 97.5 mg/dL
Microalb/Creat Ratio: <3 mg/g creat (ref 0–29)
Microalbumin, Urine: <3 ug/mL

## 2021-06-15 LAB — T4, FREE: Free T4: 1.25 ng/dL (ref 0.82–1.77)

## 2021-06-15 LAB — TSH: TSH: 6.21 u[IU]/mL — ABNORMAL HIGH (ref 0.450–4.500)

## 2021-07-03 NOTE — Progress Notes (Signed)
Gastroenterology Office Note     Primary Care Physician:  Babs Sciara, MD  Primary Gastroenterologist: Dr. Jena Gauss   Chief Complaint   Chief Complaint  Patient presents with   Follow-up    diverticulosis     History of Present Illness   Dylan Chapman is a 71 y.o. male presenting today in follow-up with a history of GERD, esophagitis, tubular adenoma with surveillance due 2025 (last colonoscopy 2018).   He notes that he had abdominal pain in [place]. Outside records in Care Everywhere with CT revealing extensive diverticulosis without diverticulitis.   His wife passed after a long battle with cancer in Aug 2022. Noted rectal bleeding after she passed. Noted rectal bleeding after vigorous walking. Hasn't had bleeding since December. Feels great. No constipation, diarrhea. No GERD. No dysphagia. Good appetite.    Past Medical History:  Diagnosis Date   Ectatic abdominal aorta (HCC) 11/18/2018   Ultrasound back in 2018 showed minimal change recommended to do a follow-up ultrasound 2023   GERD with esophagitis    Hyperlipidemia    Migraines     Past Surgical History:  Procedure Laterality Date   BACK SURGERY  2011   COLONOSCOPY  2007   Dr. Jena Gauss: normal rectum and scattered sigmoid diverticulum    COLONOSCOPY N/A 10/09/2016   Dr. Jena Gauss: non-bleeding internal hemorrhoids, diverticulosis in sigmoid and descending colon, one 4 mm tubular adenoma at recto-sigmoid s/p cold snare removal. Surveillance in 7 years   ESOPHAGOGASTRODUODENOSCOPY N/A 10/09/2016   Dr. Jena Gauss: LA Grade A esophagitis, normal stomach, normal ampulla and second portion of duodenum, no specimens collected   HERNIA REPAIR Left    left inguinal   LAMINECTOMY     MALONEY DILATION N/A 10/09/2016   Procedure: Elease Hashimoto DILATION;  Surgeon: Corbin Ade, MD;  [facility];  Service: Endoscopy;  Laterality: N/A;   SHOULDER SURGERY      Current Outpatient Medications  Medication  Sig Dispense Refill   atorvastatin (LIPITOR) 80 MG tablet Take 1 tablet (80 mg total) by mouth daily. 90 tablet 3   Ketoprofen POWD AS DIRECTED. 60 g 3   Multiple Vitamin (MULTIVITAMIN WITH MINERALS) TABS tablet Take 1 tablet by mouth daily.     PRESCRIPTION MEDICATION Compound for headaches     terbinafine (LAMISIL) 250 MG tablet Take 1 tablet (250 mg total) by mouth daily. 28 tablet 2   Atogepant (QULIPTA) 30 MG TABS Take 30 mg by mouth daily. (Patient not taking: Reported on 07/04/2021) 30 tablet 3   propranolol (INDERAL) 80 MG tablet Take one tab 3 times daily as needed for cluster headaches (Patient not taking: Reported on 07/04/2021) 30 tablet 6   triamcinolone cream (KENALOG) 0.1 % Apply to affected areas BID (Patient not taking: Reported on 07/04/2021) 240 g 0   No current facility-administered medications for this visit.    Allergies as of 07/04/2021 - Review Complete 07/04/2021  Allergen Reaction Noted   Penicillins Hives 06/28/2012    Family History  Problem Relation Age of Onset   Heart disease Father    Prostate cancer Father    Migraines Daughter    Colon cancer Neg Hx    Colon polyps Neg Hx     Social History   Socioeconomic History   Marital status: Widowed    Spouse name: Not on file   Number of children: 2   Years of education: Not on file   Highest education level:  Bachelor's degree (e.g., BA, AB, BS)  Occupational History   Occupation: self-employed    Comment: [employer]   Tobacco Use   Smoking status: Former    Types: Cigarettes   Smokeless tobacco: Never   Tobacco comments:    Quit age 79  Vaping Use   Vaping Use: Never used  Substance and Sexual Activity   Alcohol use: Yes    Alcohol/week: 3.0 standard drinks    Types: 3 Glasses of wine per week    Comment: 3 glasses wine/week; occ beer   Drug use: No   Sexual activity: Not Currently  Other Topics Concern   Not on file  Social History Narrative   Lives alone    2 daughters    Social Determinants of Health   Financial Resource Strain: Low Risk    Difficulty of Paying Living Expenses: Not hard at all  Food Insecurity: No Food Insecurity   Worried About Programme researcher, broadcasting/film/video in the Last Year: Never true   Barista in the Last Year: Never true  Transportation Needs: No Transportation Needs   Lack of Transportation (Medical): No   Lack of Transportation (Non-Medical): No  Physical Activity: Inactive   Days of Exercise per Week: 0 days   Minutes of Exercise per Session: 0 min  Stress: No Stress Concern Present   Feeling of Stress : Not at all  Social Connections: Moderately Integrated   Frequency of Communication with Friends and Family: More than three times a week   Frequency of Social Gatherings with Friends and Family: More than three times a week   Attends Religious Services: More than 4 times per year   Active Member of [employer] or Organizations: Yes   Attends Banker Meetings: More than 4 times per year   Marital Status: Widowed  Catering manager Violence: Not At Risk   Fear of Current or Ex-Partner: No   Emotionally Abused: No   Physically Abused: No   Sexually Abused: No     Review of Systems   Gen: Denies any fever, chills, fatigue, weight loss, lack of appetite.  CV: Denies chest pain, heart palpitations, peripheral edema, syncope.  Resp: Denies shortness of breath at rest or with exertion. Denies wheezing or cough.  GI: see HPI GU : Denies urinary burning, urinary frequency, urinary hesitancy MS: Denies joint pain, muscle weakness, cramps, or limitation of movement.  Derm: Denies rash, itching, dry skin Psych: Denies depression, anxiety, memory loss, and confusion Heme: Denies bruising, bleeding, and enlarged lymph nodes.   Physical Exam   BP 118/72   Pulse 60   Temp (!) 96.9 F (36.1 C)   Ht 5\' 7"  (1.702 m)   Wt 156 lb 9.6 oz (71 kg)   BMI 24.53 kg/m  General:   Alert and oriented. Pleasant and cooperative.  Well-nourished and well-developed.  Head:  Normocephalic and atraumatic. Eyes:  Without icterus Lungs: clear bilaterally Cardiac: S1 S2 present without murmurs Abdomen:  +BS, soft, non-tender and non-distended. No HSM noted. No guarding or rebound. No masses appreciated.  Rectal:  Deferred  Msk:  Symmetrical without gross deformities. Normal posture. Extremities:  Without edema. Neurologic:  Alert and  oriented x4;  grossly normal neurologically. Skin:  Intact without significant lesions or rashes. Psych:  Alert and cooperative. Normal mood and affect.   Assessment   Dylan Chapman is a 71 y.o. male presenting today in follow-up with a history of GERD, tubular adenoma in 2018,  and now with recent rectal bleeding now resolved.   Suspect rectal bleeding secondary to benign anorectal source. However, as his last colonoscopy was approximately 5 years ago, will pursue diagnostic colonoscopy. He does have a history of hemorrhoid banding in the past.  GERD now resolved. No concerning upper GI signs/symptoms. No history of Barrett's.     PLAN   Proceed with colonoscopy by Dr. Jena Gauss in near future: the risks, benefits, and alternatives have been discussed with the patient in detail. The patient states understanding and desires to proceed. Further recommendations to follow   Gelene Mink, PhD, ANP-BC [facility]

## 2021-07-04 ENCOUNTER — Encounter: Payer: Self-pay | Admitting: *Deleted

## 2021-07-04 ENCOUNTER — Encounter: Payer: Self-pay | Admitting: Gastroenterology

## 2021-07-04 ENCOUNTER — Ambulatory Visit: Payer: Medicare HMO | Admitting: Gastroenterology

## 2021-07-04 ENCOUNTER — Telehealth: Payer: Self-pay | Admitting: Internal Medicine

## 2021-07-04 ENCOUNTER — Other Ambulatory Visit: Payer: Self-pay

## 2021-07-04 VITALS — BP 118/72 | HR 60 | Temp 96.9°F | Ht 67.0 in | Wt 156.6 lb

## 2021-07-04 DIAGNOSIS — Z8719 Personal history of other diseases of the digestive system: Secondary | ICD-10-CM | POA: Diagnosis not present

## 2021-07-04 DIAGNOSIS — D369 Benign neoplasm, unspecified site: Secondary | ICD-10-CM | POA: Diagnosis not present

## 2021-07-04 MED ORDER — PEG 3350-KCL-NA BICARB-NACL 420 G PO SOLR: ORAL | 0 refills | Status: DC

## 2021-07-04 NOTE — Patient Instructions (Signed)
We are arranging a colonoscopy with Dr. Gala Romney in the near future. ? ?I am glad you are doing well! ? ?I enjoyed seeing you again today! As you know, I value our relationship and want to provide genuine, compassionate, and quality care. I welcome your feedback. If you receive a survey regarding your visit,  I greatly appreciate you taking time to fill this out. See you next time! ? ?Annitta Needs, PhD, ANP-BC ?[facility]  ? ?

## 2021-07-04 NOTE — Telephone Encounter (Signed)
Pt was seen today and called to speak with scheduler that scheduled his colonoscopy. I told him she was with another patient and I could take a message. He wants to change the time on his procedure date to the afternoon and wants revived instructions. Please advise. [phone] ?

## 2021-07-04 NOTE — Telephone Encounter (Signed)
Called pt. He moved procedure time to 12pm on 4/24. New instructions mailed ?

## 2021-07-27 ENCOUNTER — Encounter: Payer: Self-pay | Admitting: Family Medicine

## 2021-07-31 ENCOUNTER — Encounter: Payer: Self-pay | Admitting: Family Medicine

## 2021-07-31 DIAGNOSIS — E038 Other specified hypothyroidism: Secondary | ICD-10-CM

## 2021-07-31 DIAGNOSIS — E7849 Other hyperlipidemia: Secondary | ICD-10-CM

## 2021-07-31 NOTE — Telephone Encounter (Signed)
See other mychart message.

## 2021-07-31 NOTE — Telephone Encounter (Signed)
Nurses-he would be due lipid, TSH, free T4 before his follow-up visit in July.  Thanks-Dr. [name] ?

## 2021-08-02 ENCOUNTER — Other Ambulatory Visit: Payer: Self-pay | Admitting: Family Medicine

## 2021-08-09 ENCOUNTER — Encounter: Payer: Self-pay | Admitting: Family Medicine

## 2021-08-09 DIAGNOSIS — Z79899 Other long term (current) drug therapy: Secondary | ICD-10-CM

## 2021-08-09 MED ORDER — TERBINAFINE HCL 250 MG PO TABS
250.0000 mg | ORAL_TABLET | Freq: Every day | ORAL | 0 refills | Status: DC
Start: 2021-08-09 — End: 2021-09-04

## 2021-08-09 NOTE — Telephone Encounter (Signed)
Nurses ?Guidelines relate utilizing this medication for 12 weeks ?We can do 1 additional month of refill as long as he would do a up-to-date liver profile ?If that does not completely take care of the problem then the difficult part is for some people toenail fungus is difficult to eradicate even with medication-at that point if it is still present I would recommend referral to podiatry for further input-I would not feel comfortable utilizing the medication beyond 1 additional month ? ?Please communicate all of this to [place]  ?Please order 1 additional refill  ?Please have him do an up-to-date liver profile.  Thank you ?

## 2021-08-09 NOTE — Telephone Encounter (Signed)
Hi Dylan Chapman ? ?All good questions ? ?Toenails grow out slowly.  They even grow slower as we get older. ? ?Typically a 13-monthcourse is the general guidelines we can do an additional 1 month if necessary but if you are not seeing adequate response at the end of that then podiatry consultation ? ?It is true that the nail at the base that is growing out should be fungal free because the medication works within the matrix of the nail bed where the new nail is growing.  The medication will not work on the old nail and it just generally has to grow out ? ?Occasionally we will utilize a 4th-month of Lamisil but only if we check liver functions to make sure that person is getting along with the medicine ? ?Oral Lamisil is considered to be the best initial option for toenail fungus but because the toenail fungus is difficult to eradicate, and also is a common issue the older we get-the treatment success rate is approximately 65 to 70%-meaning that 30% of the time the medication does not completely eradicate the problem-or the problem comes back ? ?Podiatry sometimes will try other measures but even does have potential failure rate ? ?For some individuals toenail fungus is a inevitability of life and getting older ? ?Certainly in your situation you could give this time and see if it just grows out on its own or if you would like to do 1/481-monthou could but I would not recommend it beyond that ? ?Hope that helps thanks-[name] ? ? ?

## 2021-08-14 ENCOUNTER — Other Ambulatory Visit: Payer: Self-pay

## 2021-08-14 ENCOUNTER — Ambulatory Visit (HOSPITAL_COMMUNITY)
Admission: RE | Admit: 2021-08-14 | Discharge: 2021-08-14 | Disposition: A | Discharge status: Home / Self Care | Payer: Medicare HMO | Attending: Internal Medicine | Admitting: Internal Medicine

## 2021-08-14 ENCOUNTER — Ambulatory Visit (HOSPITAL_BASED_OUTPATIENT_CLINIC_OR_DEPARTMENT_OTHER): Payer: Medicare HMO | Admitting: Anesthesiology

## 2021-08-14 ENCOUNTER — Ambulatory Visit (HOSPITAL_COMMUNITY): Payer: Medicare HMO | Admitting: Anesthesiology

## 2021-08-14 ENCOUNTER — Encounter (HOSPITAL_COMMUNITY): Payer: Self-pay | Admitting: Internal Medicine

## 2021-08-14 ENCOUNTER — Encounter (HOSPITAL_COMMUNITY)
Admission: RE | Disposition: A | Discharge status: Home / Self Care | Payer: Self-pay | Source: Home / Self Care | Attending: Internal Medicine

## 2021-08-14 DIAGNOSIS — D122 Benign neoplasm of ascending colon: Secondary | ICD-10-CM | POA: Insufficient documentation

## 2021-08-14 DIAGNOSIS — K5731 Diverticulosis of large intestine without perforation or abscess with bleeding: Secondary | ICD-10-CM | POA: Diagnosis not present

## 2021-08-14 DIAGNOSIS — D123 Benign neoplasm of transverse colon: Secondary | ICD-10-CM | POA: Diagnosis not present

## 2021-08-14 DIAGNOSIS — K5730 Diverticulosis of large intestine without perforation or abscess without bleeding: Secondary | ICD-10-CM | POA: Diagnosis not present

## 2021-08-14 DIAGNOSIS — Z87891 Personal history of nicotine dependence: Secondary | ICD-10-CM | POA: Insufficient documentation

## 2021-08-14 DIAGNOSIS — E02 Subclinical iodine-deficiency hypothyroidism: Secondary | ICD-10-CM | POA: Diagnosis not present

## 2021-08-14 DIAGNOSIS — K635 Polyp of colon: Secondary | ICD-10-CM

## 2021-08-14 DIAGNOSIS — K921 Melena: Secondary | ICD-10-CM

## 2021-08-14 DIAGNOSIS — Z86010 Personal history of colonic polyps: Secondary | ICD-10-CM | POA: Insufficient documentation

## 2021-08-14 HISTORY — PX: POLYPECTOMY: SHX5525

## 2021-08-14 HISTORY — PX: COLONOSCOPY WITH PROPOFOL: SHX5780

## 2021-08-14 SURGERY — COLONOSCOPY WITH PROPOFOL
Anesthesia: General

## 2021-08-14 MED ORDER — SIMETHICONE 40 MG/0.6ML PO SUSP
ORAL | Status: DC | PRN
  Administered 2021-08-14: .6 mL

## 2021-08-14 MED ORDER — LACTATED RINGERS IV SOLN: INTRAVENOUS | Status: DC

## 2021-08-14 MED ORDER — PROPOFOL 500 MG/50ML IV EMUL
INTRAVENOUS | Status: DC | PRN
  Administered 2021-08-14: 150 ug/kg/min via INTRAVENOUS

## 2021-08-14 MED ORDER — PROPOFOL 10 MG/ML IV BOLUS
INTRAVENOUS | Status: DC | PRN
  Administered 2021-08-14: 100 mg via INTRAVENOUS

## 2021-08-14 NOTE — Anesthesia Preprocedure Evaluation (Addendum)
Anesthesia Evaluation  ?Patient identified by MRN, date of birth, ID band ?Patient awake ? ? ? ?Reviewed: ?Allergy & Precautions, NPO status , Patient's Chart, lab work & pertinent test results ? ?Airway ?Mallampati: II ? ?TM Distance: >3 FB ?Neck ROM: Full ? ? ? Dental ? ?(+) Dental Advisory Given ?Crowns :   ?Pulmonary ?former smoker,  ?  ?Pulmonary exam normal ?breath sounds clear to auscultation ? ? ? ? ? ? Cardiovascular ?negative cardio ROS ?Normal cardiovascular exam ?Rhythm:Regular Rate:Normal ? ? ?  ?Neuro/Psych ? Headaches, negative psych ROS  ? GI/Hepatic ?Neg liver ROS, GERD  ,  ?Endo/Other  ?Hypothyroidism  ? Renal/GU ?negative Renal ROS  ?negative genitourinary ?  ?Musculoskeletal ?negative musculoskeletal ROS ?(+)  ? Abdominal ?  ?Peds ?negative pediatric ROS ?(+)  Hematology ?negative hematology ROS ?(+)   ?Anesthesia Other Findings ? ? Reproductive/Obstetrics ?negative OB ROS ? ?  ? ? ? ? ? ? ? ? ? ? ? ? ? ?  ?  ? ? ? ? ? ? ? ?Anesthesia Physical ?Anesthesia Plan ? ?ASA: 2 ? ?Anesthesia Plan: General  ? ?Post-op Pain Management: Minimal or no pain anticipated  ? ?Induction: Intravenous ? ?PONV Risk Score and Plan: Propofol infusion ? ?Airway Management Planned: Nasal Cannula and Natural Airway ? ?Additional Equipment:  ? ?Intra-op Plan:  ? ?Post-operative Plan:  ? ?Informed Consent: I have reviewed the patients History and Physical, chart, labs and discussed the procedure including the risks, benefits and alternatives for the proposed anesthesia with the patient or authorized representative who has indicated his/her understanding and acceptance.  ? ? ? ?Dental advisory given ? ?Plan Discussed with: CRNA and Surgeon ? ?Anesthesia Plan Comments:   ? ? ? ? ? ? ?Anesthesia Quick Evaluation ? ?

## 2021-08-14 NOTE — Op Note (Signed)
[facility] ?Patient Name: Dylan Chapman ?Procedure Date: 08/14/2021 10:41 AM ?MRN: [id number] ?Date of Birth: 12/12/1950 ?Attending MD: Norvel Richards , MD ?CSN: [id number] ?Age: 71 ?Admit Type: Outpatient ?Procedure:                Colonoscopy ?Indications:              Hematochezia ?Providers:                Norvel Richards, MD, Janeece Riggers, RN, Eugene Garnet  ?                          Shanon Brow, Technician ?Referring MD:              ?Medicines:                Propofol per Anesthesia ?Complications:            No immediate complications. ?Estimated Blood Loss:     Estimated blood loss was minimal. Estimated blood  ?                          loss was minimal. ?Procedure:                Pre-Anesthesia Assessment: ?                          - Prior to the procedure, a History and Physical  ?                          was performed, and patient medications and  ?                          allergies were reviewed. The patient's tolerance of  ?                          previous anesthesia was also reviewed. The risks  ?                          and benefits of the procedure and the sedation  ?                          options and risks were discussed with the patient.  ?                          All questions were answered, and informed consent  ?                          was obtained. Prior Anticoagulants: The patient has  ?                          taken no previous anticoagulant or antiplatelet  ?                          agents. ASA Grade Assessment: II - A patient with  ?                          mild systemic  disease. After reviewing the risks  ?                          and benefits, the patient was deemed in  ?                          satisfactory condition to undergo the procedure. ?                          After obtaining informed consent, the colonoscope  ?                          was passed under direct vision. Throughout the  ?                          procedure, the patient's blood pressure, pulse,  and  ?                          oxygen saturations were monitored continuously. The  ?                          [phone]) scope was introduced through the  ?                          anus and advanced to the the cecum, identified by  ?                          appendiceal orifice and ileocecal valve. The  ?                          colonoscopy was performed without difficulty. The  ?                          patient tolerated the procedure well. The quality  ?                          of the bowel preparation was adequate. ?Scope In: 12:35:47 PM ?Scope Out: 12:52:45 PM ?Scope Withdrawal Time: 0 hours 11 minutes 27 seconds  ?Total Procedure Duration: 0 hours 16 minutes 58 seconds  ?Findings: ?     The perianal and digital rectal examinations were normal. ?     Scattered medium-mouthed diverticula were found in the sigmoid colon and  ?     descending colon. ?     Two sessile polyps were found in the splenic flexure and ascending  ?     colon. The polyps were 4 to 6 mm in size. These polyps were removed with  ?     a cold snare. Resection and retrieval were complete. Estimated blood  ?     loss was minimal. ?     The exam was otherwise without abnormality on direct and retroflexion  ?     views. ?Impression:               - Diverticulosis in the sigmoid colon and in the  ?  descending colon. ?                          - Two 4 to 6 mm polyps at the splenic flexure and  ?                          in the ascending colon, removed with a cold snare.  ?                          Resected and retrieved. ?                          - The examination was otherwise normal on direct  ?                          and retroflexion views. ?Moderate Sedation: ?     Moderate (conscious) sedation was personally administered by an  ?     anesthesia professional. The following parameters were monitored: oxygen  ?     saturation, heart rate, blood pressure, respiratory rate, EKG, adequacy  ?     of pulmonary  ventilation, and response to care. ?Recommendation:           - Patient has a contact number available for  ?                          emergencies. The signs and symptoms of potential  ?                          delayed complications were discussed with the  ?                          patient. Return to normal activities tomorrow.  ?                          Written discharge instructions were provided to the  ?                          patient. ?                          - Resume previous diet. ?                          - Continue present medications. ?                          - Repeat colonoscopy date to be determined after  ?                          pending pathology results are reviewed for  ?                          surveillance. ?                          - Return to GI office in 6 months. ?Procedure Code(s):        ---  Professional --- ?                          [phone], Colonoscopy, flexible; with removal of  ?                          tumor(s), polyp(s), or other lesion(s) by snare  ?                          technique ?Diagnosis Code(s):        --- Professional --- ?                          K63.5, Polyp of colon ?                          K92.1, Melena (includes Hematochezia) ?                          K57.30, Diverticulosis of large intestine without  ?                          perforation or abscess without bleeding ?CPT copyright 2019 American Medical Association. All rights reserved. ?The codes documented in this report are preliminary and upon coder review may  ?be revised to meet current compliance requirements. ?Cristopher Estimable. Ryu Cerreta, MD ?Norvel Richards, MD ?08/14/2021 12:57:25 PM ?This report has been signed electronically. ?Number of Addenda: 0 ?

## 2021-08-14 NOTE — H&P (Signed)
$'@LOGO'T$ @ ? ? ?Primary Care Physician:  Kathyrn Drown, MD ?Primary Gastroenterologist:  Dr. Gala Romney ? ?Pre-Procedure History & Physical: ?HPI:  Dylan Chapman is a 71 y.o. male here for diagnostic colonoscopy.  History of paper hematochezia.  Distant history of colonic adenomas; here for diagnostic colonoscopy. ? ? ?Past Medical History:  ?Diagnosis Date  ? 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 Ultrasound back in 2018 showed minimal change recommended to do a follow-up ultrasound 2023  ? GERD with esophagitis   ? Hyperlipidemia   ? Migraines   ? ? ?Past Surgical History:  ?Procedure Laterality Date  ? BACK SURGERY  2011  ? COLONOSCOPY  2007  ? Dr. Gala Romney: normal rectum and scattered sigmoid diverticulum   ? COLONOSCOPY N/A 10/09/2016  ? Dr. Gala Romney: non-bleeding internal hemorrhoids, diverticulosis in sigmoid and descending colon, one 4 mm tubular adenoma at recto-sigmoid s/p cold snare removal. Surveillance in 7 years  ? ESOPHAGOGASTRODUODENOSCOPY N/A 10/09/2016  ? Dr. Gala Romney: LA Grade A esophagitis, normal stomach, normal ampulla and second portion of duodenum, no specimens collected  ? HERNIA REPAIR Left   ? left inguinal  ? LAMINECTOMY    ? MALONEY DILATION N/A 10/09/2016  ? Procedure: MALONEY DILATION;  Surgeon: Daneil Dolin, MD;  Location: [facility];  Service: Endoscopy;  Laterality: N/A;  ? SHOULDER SURGERY    ? ? ?Prior to Admission medications   ?Medication Sig Start Date End Date Taking? Authorizing Provider  ?atorvastatin (LIPITOR) 80 MG tablet Take 1 tablet (80 mg total) by mouth daily. 05/10/21  Yes Kathyrn Drown, MD  ?Ketoprofen POWD AS DIRECTED. ?Patient taking differently: Take 1 capsule by mouth daily as needed (migraines.). Ketoprofen 12.5-mg/Riboflavin 100-mg/Caffeine 65-mg 03/22/21  Yes Luking, Elayne Snare, MD  ?Multiple Vitamin (MULTIVITAMIN WITH MINERALS) TABS tablet Take 1 tablet by mouth in the morning.   Yes [provider]  ?terbinafine (LAMISIL) 250 MG tablet Take 1 tablet (250  mg total) by mouth daily. 08/09/21  Yes Kathyrn Drown, MD  ?triamcinolone cream (KENALOG) 0.1 % Apply 1 application. topically 2 (two) times daily as needed (skin irritation/rash (under arms)).   Yes [provider]  ?polyethylene glycol-electrolytes (NULYTELY) 420 g solution As directed 07/04/21   Prashant Glosser, Cristopher Estimable, MD  ? ? ?Allergies as of 07/04/2021 - Review Complete 07/04/2021  ?Allergen Reaction Noted  ? Penicillins Hives 06/28/2012  ? ? ?Family History  ?Problem Relation Age of Onset  ? Heart disease Father   ? Prostate cancer Father   ? Migraines Daughter   ? Colon cancer Neg Hx   ? Colon polyps Neg Hx   ? ? ?Social History  ? ?Socioeconomic History  ? Marital status: Widowed  ?  Spouse name: Not on file  ? Number of children: 2  ? Years of education: Not on file  ? Highest education level: Bachelor's degree (e.g., BA, AB, BS)  ?Occupational History  ? Occupation: self-employed  ?  Comment: [employer]   ?Tobacco Use  ? Smoking status: Former  ?  Types: Cigarettes  ? Smokeless tobacco: Never  ? Tobacco comments:  ?  Quit age 28  ?Vaping Use  ? Vaping Use: Never used  ?Substance and Sexual Activity  ? Alcohol use: Yes  ?  Alcohol/week: 2.0 standard drinks  ?  Types: 2 Glasses of wine per week  ?  Comment: 3 glasses wine/week; occ beer  ? Drug use: No  ? Sexual activity: Not Currently  ?Other Topics  Concern  ? Not on file  ?Social History Narrative  ? Lives alone   ? 2 daughters  ? ?Social Determinants of Health  ? ?Financial Resource Strain: Low Risk   ? Difficulty of Paying Living Expenses: Not hard at all  ?Food Insecurity: No Food Insecurity  ? Worried About Charity fundraiser in the Last Year: Never true  ? Ran Out of Food in the Last Year: Never true  ?Transportation Needs: No Transportation Needs  ? Lack of Transportation (Medical): No  ? Lack of Transportation (Non-Medical): No  ?Physical Activity: Inactive  ? Days of Exercise per Week: 0 days  ? Minutes of Exercise per Session: 0 min   ?Stress: No Stress Concern Present  ? Feeling of Stress : Not at all  ?Social Connections: Moderately Integrated  ? Frequency of Communication with Friends and Family: More than three times a week  ? Frequency of Social Gatherings with Friends and Family: More than three times a week  ? Attends Religious Services: More than 4 times per year  ? Active Member of Clubs or Organizations: Yes  ? Attends Archivist Meetings: More than 4 times per year  ? Marital Status: Widowed  ?Intimate Partner Violence: Not At Risk  ? Fear of Current or Ex-Partner: No  ? Emotionally Abused: No  ? Physically Abused: No  ? Sexually Abused: No  ? ? ?Review of Systems: ?See HPI, otherwise negative ROS ? ?Physical Exam: ?There were no vitals taken for this visit. ?General:   Alert,  Well-developed, well-nourished, pleasant and cooperative in NAD ?Neck:  Supple; no masses or thyromegaly. No significant cervical adenopathy. ?Lungs:  Clear throughout to auscultation.   No wheezes, crackles, or rhonchi. No acute distress. ?Heart:  Regular rate and rhythm; no murmurs, clicks, rubs,  or gallops. ?Abdomen: Non-distended, normal bowel sounds.  Soft and nontender without appreciable mass or hepatosplenomegaly.  ?Pulses:  Normal pulses noted. ?Extremities:  Without clubbing or edema. ? ?Impression/Plan: 71 year old gentleman here for surveillance colonoscopy.  History of colonic adenoma.  Self-limiting rectal bleeding months ago.  No GI symptoms at this time. ? ?I have offered the patient a colonoscopy today per plan.  The risks, benefits, limitations, alternatives and imponderables have been reviewed with the patient. Questions have been answered. All parties are agreeable.   ? ? ? ? ?Notice: This dictation was prepared with Dragon dictation along with smaller phrase technology. Any transcriptional errors that result from this process are unintentional and may not be corrected upon review.  ? ?

## 2021-08-14 NOTE — Discharge Instructions (Addendum)
?  Colonoscopy ?Discharge Instructions ? ?Read the instructions outlined below and refer to this sheet in the next few weeks. These discharge instructions provide you with general information on caring for yourself after you leave the hospital. Your doctor may also give you specific instructions. While your treatment has been planned according to the most current medical practices available, unavoidable complications occasionally occur. If you have any problems or questions after discharge, call Dr. Gala Romney at [phone]. ?ACTIVITY ?You may resume your regular activity, but move at a slower pace for the next 24 hours.  ?Take frequent rest periods for the next 24 hours.  ?Walking will help get rid of the air and reduce the bloated feeling in your belly (abdomen).  ?No driving for 24 hours (because of the medicine (anesthesia) used during the test).   ?Do not sign any important legal documents or operate any machinery for 24 hours (because of the anesthesia used during the test).  ?NUTRITION ?Drink plenty of fluids.  ?You may resume your normal diet as instructed by your doctor.  ?Begin with a light meal and progress to your normal diet. Heavy or fried foods are harder to digest and may make you feel sick to your stomach (nauseated).  ?Avoid alcoholic beverages for 24 hours or as instructed.  ?MEDICATIONS ?You may resume your normal medications unless your doctor tells you otherwise.  ?WHAT YOU CAN EXPECT TODAY ?Some feelings of bloating in the abdomen.  ?Passage of more gas than usual.  ?Spotting of blood in your stool or on the toilet paper.  ?IF YOU HAD POLYPS REMOVED DURING THE COLONOSCOPY: ?No aspirin products for 7 days or as instructed.  ?No alcohol for 7 days or as instructed.  ?Eat a soft diet for the next 24 hours.  ?FINDING OUT THE RESULTS OF YOUR TEST ?Not all test results are available during your visit. If your test results are not back during the visit, make an appointment with your caregiver to find out the  results. Do not assume everything is normal if you have not heard from your caregiver or the medical facility. It is important for you to follow up on all of your test results.  ?SEEK IMMEDIATE MEDICAL ATTENTION IF: ?You have more than a spotting of blood in your stool.  ?Your belly is swollen (abdominal distention).  ?You are nauseated or vomiting.  ?You have a temperature over 101.  ?You have abdominal pain or discomfort that is severe or gets worse throughout the day.   ? ?2 polyps removed from your colon. ? ?Colon polyp and diverticulosis information provided ? ?Further recommendations to follow pending review of pathology report ? ?Office appointment with Roseanne Kaufman in 6 months ? ?At patient request, I called Renata Caprice at [phone] -unable to reach ? ?

## 2021-08-14 NOTE — Transfer of Care (Signed)
Immediate Anesthesia Transfer of Care Note ? ?Patient: Dylan Chapman ? ?Procedure(s) Performed: COLONOSCOPY WITH PROPOFOL ?POLYPECTOMY ? ?Patient Location: PACU ? ?Anesthesia Type:General ? ?Level of Consciousness: awake, alert , oriented and patient cooperative ? ?Airway & Oxygen Therapy: Patient Spontanous Breathing ? ?Post-op Assessment: Report given to RN, Post -op Vital signs reviewed and stable and Patient moving all extremities X 4 ? ?Post vital signs: Reviewed and stable ? ?Last Vitals:  ?Vitals Value Taken Time  ?BP    ?Temp    ?Pulse    ?Resp    ?SpO2    ? ? ?Last Pain:  ?Vitals:  ? 08/14/21 1232  ?TempSrc:   ?PainSc: 0-No pain  ?   ? ?Patients Stated Pain Goal: 6 (08/14/21 1101) ? ?Complications: No notable events documented. ?

## 2021-08-14 NOTE — Anesthesia Postprocedure Evaluation (Signed)
Anesthesia Post Note ? ?Patient: KADEN DAUGHDRILL ? ?Procedure(s) Performed: COLONOSCOPY WITH PROPOFOL ?POLYPECTOMY ? ?Patient location during evaluation: Endoscopy ?Anesthesia Type: General ?Level of consciousness: awake and alert and oriented ?Pain management: pain level controlled ?Vital Signs Assessment: post-procedure vital signs reviewed and stable ?Respiratory status: spontaneous breathing, nonlabored ventilation and respiratory function stable ?Cardiovascular status: blood pressure returned to baseline and stable ?Postop Assessment: no apparent nausea or vomiting ?Anesthetic complications: no ? ? ?No notable events documented. ? ? ?Last Vitals:  ?Vitals:  ? 08/14/21 1101 08/14/21 1256  ?BP: (!) 157/91 106/64  ?Pulse:  78  ?Resp: 18 16  ?Temp: 36.9 ?C 36.4 ?C  ?SpO2: 98% 96%  ?  ?Last Pain:  ?Vitals:  ? 08/14/21 1256  ?TempSrc: Oral  ?PainSc: 0-No pain  ? ? ?  ?  ?  ?  ?  ?  ? ?Camilah Spillman C Rina Adney ? ? ? ? ?

## 2021-08-15 ENCOUNTER — Encounter: Payer: Self-pay | Admitting: Internal Medicine

## 2021-08-15 LAB — SURGICAL PATHOLOGY

## 2021-08-16 ENCOUNTER — Encounter (HOSPITAL_COMMUNITY): Payer: Self-pay | Admitting: Internal Medicine

## 2021-08-30 ENCOUNTER — Telehealth: Payer: Self-pay | Admitting: Family Medicine

## 2021-08-30 NOTE — Telephone Encounter (Signed)
PERCERT FOR THE FOLLOWING ? ?[facility] [place] [facility] DUPLEX -DR. CONSTANTINOS KREMPASKY ? ?08/31/2021 [facility] OFFICE ?

## 2021-08-31 ENCOUNTER — Ambulatory Visit (INDEPENDENT_AMBULATORY_CARE_PROVIDER_SITE_OTHER): Payer: Medicare HMO

## 2021-08-31 DIAGNOSIS — I77811 Abdominal aortic ectasia: Secondary | ICD-10-CM

## 2021-09-04 ENCOUNTER — Ambulatory Visit: Payer: Medicare HMO | Admitting: Psychiatry

## 2021-09-04 ENCOUNTER — Encounter: Payer: Self-pay | Admitting: Psychiatry

## 2021-09-04 VITALS — BP 125/76 | HR 61 | Ht 67.0 in | Wt 151.0 lb

## 2021-09-04 DIAGNOSIS — G43019 Migraine without aura, intractable, without status migrainosus: Secondary | ICD-10-CM

## 2021-09-04 MED ORDER — AIMOVIG 70 MG/ML ~~LOC~~ SOAJ
70.0000 mg | SUBCUTANEOUS | 3 refills | Status: DC
Start: 2021-09-04 — End: 2021-11-15

## 2021-09-04 NOTE — Progress Notes (Signed)
? ?  CC:  headaches ? ?Follow-up Visit ? ?Last visit: 05/29/21 ? ?Brief HPI: ?71 year old male with a history of cluster headaches, HLD, hypothyroidism, ectatic abdominal aorta who follows in clinic for migraines. ? ?At his last visit Qulipta was prescribed for prevention. ? ?Interval History: ?Lenoria Chime was approved by insurance, but his copay was over $100 so he did not pick it up. He continues to have 2-6 headaches per week. Typically wakes up with the headaches at 4 AM. They are associated with osmophobia and nausea. They continue to respond well to Ketoprofen, B2, and caffeine for rescue. ? ?He would like to have a sleep study done as he continues to frequently wake up with headaches. ? ?Headache days per month: 14 ?Headache free days per month: 16 ? ?Current Headache Regimen: ?Preventative: none ?Abortive: ketoprofen, B2, caffeine ? ? ?Prior Therapies                                  ?Imitrex - took too long to start working ?Ketoprofen ?B2 ?propranolol ?Gabapentin ?Topamax ?O2 ? ?Physical Exam:  ? ?Vital Signs: ?BP 125/76   Pulse 61   Ht '5\' 7"'$  (1.702 m)   Wt 151 lb (68.5 kg)   BMI 23.65 kg/m?  ?GENERAL:  well appearing, in no acute distress, alert  ?SKIN:  Color, texture, turgor normal. No rashes or lesions ?HEAD:  Normocephalic/atraumatic. ?RESP: normal respiratory effort ?MSK:  No gross joint deformities.  ? ?NEUROLOGICAL: ?Mental Status: Alert, oriented to person, place and time, Follows commands, and Speech fluent and appropriate. ?Cranial Nerves: PERRL, face symmetric, no dysarthria, hearing grossly intact ?Motor: moves all extremities equally ?Gait: normal-based. ? ?IMPRESSION: ?71 year old male with a history of HLD, hypothyroidism, ectatic abdominal aorta who presents for follow up of migraines. Did not end up trying Qulipta as it was too expensive. Will start [place] for migraine prevention. First dose given in the office today. Will place referral to Sleep team to see if he would benefit from a sleep  study. ? ?PLAN: ?-Preventive: Start Aimovig 70 mg monthly ?-Referral to Sleep placed ?-next steps: consider Botox, SNRI for prevention. Consider Zomig nasal spray for rescue ? ?Follow-up: 6 months ? ?I spent a total of 37 minutes on the date of the service. Headache education was done. Discussed treatment options including preventive medications. Discussed medication side effects, adverse reactions and drug interactions. Written educational materials and patient instructions outlining all of the above were given. ? ?Genia Harold, MD ?09/04/21 ?4:21 PM ? ?

## 2021-09-04 NOTE — Patient Instructions (Addendum)
Start Aimovig 70 mg monthly for migraine prevention ?Referral to Sleep Neurology ?

## 2021-09-05 ENCOUNTER — Telehealth: Payer: Self-pay | Admitting: *Deleted

## 2021-09-05 NOTE — Telephone Encounter (Signed)
Aimovig 70 mg PA,  (Key: [id number]. ?This approval authorizes your coverage from 04/23/2021 - 12/04/2021. Approval letter faxed to pharmacy. ?

## 2021-09-15 ENCOUNTER — Other Ambulatory Visit: Payer: Self-pay | Admitting: *Deleted

## 2021-09-15 DIAGNOSIS — I77811 Abdominal aortic ectasia: Secondary | ICD-10-CM

## 2021-09-23 ENCOUNTER — Encounter: Payer: Self-pay | Admitting: Family Medicine

## 2021-09-25 NOTE — Telephone Encounter (Signed)
Nurses  Please cancel the ultrasound for June-it is not necessary You may also forward this message to [name]  [name] had a ultrasound through cardiology procedures recently which showed the aorta looks good. (This test result is under cardiology procedures and not under imaging)  There is no need to do any follow-up ultrasound  Hi Dylan Chapman-this particular ultrasound which was scheduled for later in June was part of the automated imaging follow-up from your ultrasound back in 2018.  Given that you recently completed the ultrasound through cardiovascular ultrasound services it is not necessary to repeat this.  Hope you are doing well [name]

## 2021-09-26 DIAGNOSIS — G43909 Migraine, unspecified, not intractable, without status migrainosus: Secondary | ICD-10-CM | POA: Diagnosis not present

## 2021-09-26 DIAGNOSIS — Z8249 Family history of ischemic heart disease and other diseases of the circulatory system: Secondary | ICD-10-CM | POA: Diagnosis not present

## 2021-09-26 DIAGNOSIS — Z87891 Personal history of nicotine dependence: Secondary | ICD-10-CM | POA: Diagnosis not present

## 2021-09-26 DIAGNOSIS — E785 Hyperlipidemia, unspecified: Secondary | ICD-10-CM | POA: Diagnosis not present

## 2021-09-26 DIAGNOSIS — K5900 Constipation, unspecified: Secondary | ICD-10-CM | POA: Diagnosis not present

## 2021-09-26 DIAGNOSIS — R030 Elevated blood-pressure reading, without diagnosis of hypertension: Secondary | ICD-10-CM | POA: Diagnosis not present

## 2021-10-02 ENCOUNTER — Ambulatory Visit (HOSPITAL_COMMUNITY): Payer: Medicare HMO

## 2021-10-10 ENCOUNTER — Encounter: Payer: Self-pay | Admitting: Psychiatry

## 2021-10-10 ENCOUNTER — Encounter: Payer: Self-pay | Admitting: Family Medicine

## 2021-10-13 ENCOUNTER — Ambulatory Visit (INDEPENDENT_AMBULATORY_CARE_PROVIDER_SITE_OTHER): Payer: Medicare HMO | Admitting: Family Medicine

## 2021-10-13 VITALS — BP 120/70 | HR 82 | Temp 98.2°F | Wt 153.2 lb

## 2021-10-13 DIAGNOSIS — K4090 Unilateral inguinal hernia, without obstruction or gangrene, not specified as recurrent: Secondary | ICD-10-CM

## 2021-10-16 ENCOUNTER — Encounter: Payer: Self-pay | Admitting: Family Medicine

## 2021-11-01 DIAGNOSIS — Z79899 Other long term (current) drug therapy: Secondary | ICD-10-CM | POA: Diagnosis not present

## 2021-11-01 DIAGNOSIS — E038 Other specified hypothyroidism: Secondary | ICD-10-CM | POA: Diagnosis not present

## 2021-11-01 DIAGNOSIS — E7849 Other hyperlipidemia: Secondary | ICD-10-CM | POA: Diagnosis not present

## 2021-11-02 LAB — LIPID PANEL
Chol/HDL Ratio: 3 ratio (ref 0.0–5.0)
Cholesterol, Total: 161 mg/dL (ref 100–199)
HDL: 53 mg/dL (ref >39)
LDL Chol Calc (NIH): 91 mg/dL (ref 0–99)
Triglycerides: 91 mg/dL (ref 0–149)
VLDL Cholesterol Cal: 17 mg/dL (ref 5–40)

## 2021-11-02 LAB — TSH: TSH: 6.41 u[IU]/mL — ABNORMAL HIGH (ref 0.450–4.500)

## 2021-11-02 LAB — HEPATIC FUNCTION PANEL
ALT: 20 IU/L (ref 0–44)
AST: 27 IU/L (ref 0–40)
Albumin: 4.2 g/dL (ref 3.9–4.9)
Alkaline Phosphatase: 99 IU/L (ref 44–121)
Bilirubin Total: 0.3 mg/dL (ref 0.0–1.2)
Bilirubin, Direct: <0.1 mg/dL (ref 0.00–0.40)
Total Protein: 6.6 g/dL (ref 6.0–8.5)

## 2021-11-02 LAB — T4, FREE: Free T4: 1.03 ng/dL (ref 0.82–1.77)

## 2021-11-07 ENCOUNTER — Encounter: Payer: Self-pay | Admitting: Family Medicine

## 2021-11-07 ENCOUNTER — Ambulatory Visit (INDEPENDENT_AMBULATORY_CARE_PROVIDER_SITE_OTHER): Payer: Medicare HMO | Admitting: Family Medicine

## 2021-11-07 VITALS — BP 110/73 | HR 69 | Temp 98.1°F | Ht 67.0 in | Wt 152.0 lb

## 2021-11-07 DIAGNOSIS — E038 Other specified hypothyroidism: Secondary | ICD-10-CM | POA: Diagnosis not present

## 2021-11-07 DIAGNOSIS — E7849 Other hyperlipidemia: Secondary | ICD-10-CM

## 2021-11-07 NOTE — Patient Instructions (Signed)
Hi Dylan Chapman It was good to see you today Hope your trip to [place] goes great  We will put in the orders for your labs to be done right before your wellness in January Should you have any troubles please let us know [facility]

## 2021-11-07 NOTE — Progress Notes (Signed)
   Subjective:    Patient ID: CHARVEZ VOORHIES, male    DOB: 27-Apr-1950, 71 y.o.   MRN: [id number]  HPI  Follow for thyroid , had labs done last week  Does take his cholesterol medicine He is watching his diet closely Staying active  Patient also has a question regarding a mole underneath the right eye darkened area Review of Systems     Objective:   Physical Exam  General-in no acute distress Eyes-no discharge Lungs-respiratory rate normal, CTA CV-no murmurs,RRR Extremities skin warm dry no edema Neuro grossly normal Behavior normal, alert  Area under the right eye appears to be a developing seborrheic keratosis he also has a seborrheic keratosis on the left forehead he does state the dermatologist told him that they could remove this     Assessment & Plan:  Hyperlipidemia doing well on medication continue current medicines and labs looking good  Clinical hypothyroidism monitor accordingly no intervention necessary currently follow-up in January for wellness  Seborrheic keratosis follow through with dermatology as needed  Patient has been doing a lot of hiking and exercising preparing for a trip to [place]

## 2021-11-09 ENCOUNTER — Other Ambulatory Visit: Payer: Self-pay | Admitting: Family Medicine

## 2021-11-09 DIAGNOSIS — Z125 Encounter for screening for malignant neoplasm of prostate: Secondary | ICD-10-CM

## 2021-11-09 DIAGNOSIS — E7849 Other hyperlipidemia: Secondary | ICD-10-CM

## 2021-11-09 DIAGNOSIS — E038 Other specified hypothyroidism: Secondary | ICD-10-CM

## 2021-11-09 DIAGNOSIS — Z0000 Encounter for general adult medical examination without abnormal findings: Secondary | ICD-10-CM

## 2021-11-15 ENCOUNTER — Ambulatory Visit (INDEPENDENT_AMBULATORY_CARE_PROVIDER_SITE_OTHER): Payer: Medicare HMO | Admitting: Neurology

## 2021-11-15 ENCOUNTER — Encounter: Payer: Self-pay | Admitting: Neurology

## 2021-11-15 VITALS — BP 152/90 | HR 66 | Ht 67.0 in | Wt 150.0 lb

## 2021-11-15 DIAGNOSIS — R0683 Snoring: Secondary | ICD-10-CM | POA: Diagnosis not present

## 2021-11-15 DIAGNOSIS — R519 Headache, unspecified: Secondary | ICD-10-CM

## 2021-11-15 DIAGNOSIS — G44029 Chronic cluster headache, not intractable: Secondary | ICD-10-CM | POA: Diagnosis not present

## 2021-11-15 DIAGNOSIS — G43019 Migraine without aura, intractable, without status migrainosus: Secondary | ICD-10-CM | POA: Diagnosis not present

## 2021-11-15 NOTE — Patient Instructions (Signed)
Screening for Sleep Apnea  Sleep apnea is a condition in which breathing pauses or becomes shallow during sleep. Sleep apnea screening is a test to determine if you are at risk for sleep apnea. The test includes a series of questions. It will only takes a few minutes. Your health care provider may ask you to have this test in preparation for surgery or as part of a physical exam. What are the symptoms of sleep apnea? Common symptoms of sleep apnea include: Snoring. Waking up often at night. Daytime sleepiness. Pauses in breathing. Choking or gasping during sleep. Irritability. Forgetfulness. Trouble thinking clearly. Depression. Personality changes. Most people with sleep apnea do not know that they have it. What are the advantages of sleep apnea screening? Getting screened for sleep apnea can help: Ensure your safety. It is important for your health care providers to know whether or not you have sleep apnea, especially if you are having surgery or have other long-term (chronic) health conditions. Improve your health and allow you to get a better night's rest. Restful sleep can help you: Have more energy. Lose weight. Improve high blood pressure. Improve diabetes management. Prevent stroke. Prevent car accidents. What happens during the screening? Screening usually includes being asked a list of questions about your sleep quality. Some questions you may be asked include: Do you snore? Is your sleep restless? Do you have daytime sleepiness? Has a partner or spouse told you that you stop breathing during sleep? Have you had trouble concentrating or memory loss? What is your age? What is your neck circumference? To measure your neck, keep your back straight and gently wrap the tape measure around your neck. Put the tape measure at the middle of your neck, between your chin and collarbone. What is your sex assigned at birth? Do you have or are you being treated for high blood  pressure? If your screening test is positive, you are at risk for the condition. Further testing may be needed to confirm a diagnosis of sleep apnea. Where to find more information You can find screening tools online or at your health care clinic. For more information about sleep apnea screening and healthy sleep, visit these websites: Centers for Disease Control and Prevention: www.cdc.gov American Sleep Apnea Association: www.sleepapnea.org Contact a health care provider if: You think that you may have sleep apnea. Summary Sleep apnea screening can help determine if you are at risk for sleep apnea. It is important for your health care providers to know whether or not you have sleep apnea, especially if you are having surgery or have other chronic health conditions. You may be asked to take a screening test for sleep apnea in preparation for surgery or as part of a physical exam. This information is not intended to replace advice given to you by your health care provider. Make sure you discuss any questions you have with your health care provider. Document Revised: 03/18/2020 Document Reviewed: 03/18/2020 Elsevier Patient Education  2023 Elsevier Inc.  
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
## 2021-11-21 ENCOUNTER — Encounter: Payer: Self-pay | Admitting: Neurology

## 2021-11-24 DIAGNOSIS — K4090 Unilateral inguinal hernia, without obstruction or gangrene, not specified as recurrent: Secondary | ICD-10-CM | POA: Diagnosis not present

## 2021-11-27 ENCOUNTER — Telehealth: Payer: Self-pay | Admitting: Neurology

## 2021-11-27 NOTE — Telephone Encounter (Signed)
NPSG- [employer]na medicare Josem Kaufmann: [id number] (exp. 11/23/21 to 05/22/22).  Patient is scheduled for 01/09/22 at 9 pm. Patient chose that day due to his schedule.  Mailed packet to the patient and sent him a [employer].

## 2021-11-29 DIAGNOSIS — Z0100 Encounter for examination of eyes and vision without abnormal findings: Secondary | ICD-10-CM | POA: Diagnosis not present

## 2021-12-05 ENCOUNTER — Telehealth: Payer: Self-pay | Admitting: *Deleted

## 2021-12-05 NOTE — Telephone Encounter (Signed)
Aimovig approved. This approval authorizes your coverage from 04/23/2021 - 04/22/2022. Per message on 10/10/21 patient has chosen not to continue taking Aimovig.

## 2021-12-05 NOTE — Telephone Encounter (Signed)
Aimovig PA, Key: [id number]. Your information has been submitted to [place] [employer] Part D. [employer] will review the request and will issue a decision, typically within 1-3 days from your submission.  If [employer] has not responded in 1-3 days or if you have any questions about your ePA request, please contact [place] Medicare Part D at [phone].

## 2021-12-19 ENCOUNTER — Encounter: Payer: Self-pay | Admitting: Family Medicine

## 2021-12-19 ENCOUNTER — Ambulatory Visit (INDEPENDENT_AMBULATORY_CARE_PROVIDER_SITE_OTHER): Payer: Medicare HMO | Admitting: Family Medicine

## 2021-12-19 VITALS — BP 130/74 | HR 76 | Temp 99.1°F | Wt 154.4 lb

## 2021-12-19 DIAGNOSIS — R509 Fever, unspecified: Secondary | ICD-10-CM

## 2021-12-19 DIAGNOSIS — J019 Acute sinusitis, unspecified: Secondary | ICD-10-CM

## 2021-12-19 MED ORDER — AZITHROMYCIN 250 MG PO TABS: ORAL_TABLET | ORAL | 0 refills | Status: AC

## 2021-12-19 NOTE — Progress Notes (Signed)
   Subjective:    Patient ID: Dylan Chapman, male    DOB: Mar 31, 1951, 71 y.o.   MRN: [id number]  HPI Pt arrives to office with fever. 3 covid test and they were negative; most recent one this morning.  Patient recently was on a hiking trip Came back felt fatigued and tired did well for couple days then dramatically got worse with headache body aches sore throat not feeling good coughing up discolored phlegm denies shortness of breath but relates low energy feeling very achy  Review of Systems     Objective:   Physical Exam  General-in no acute distress Eyes-no discharge Lungs-respiratory rate normal, CTA CV-no murmurs,RRR Extremities skin warm dry no edema Neuro grossly normal Behavior normal, alert I do not find evidence of pneumonia going on.  Certainly if he does not improve over the next several days lab work and chest x-ray would be warranted possibly      Assessment & Plan:  Viral syndrome Secondary rhinosinusitis Antibiotics prescribed Warning signs discussed Hold off on checks x-ray or lab work currently Follow-up if progressive troubles Triple swab taken await results

## 2021-12-21 ENCOUNTER — Encounter: Payer: Self-pay | Admitting: Family Medicine

## 2021-12-21 LAB — COVID-19, FLU A+B AND RSV
Influenza A, NAA: NOT DETECTED
Influenza B, NAA: NOT DETECTED
RSV, NAA: NOT DETECTED
SARS-CoV-2, NAA: NOT DETECTED

## 2021-12-21 LAB — SPECIMEN STATUS REPORT

## 2022-01-03 ENCOUNTER — Encounter: Payer: Self-pay | Admitting: Family Medicine

## 2022-01-04 ENCOUNTER — Encounter: Payer: Self-pay | Admitting: Family Medicine

## 2022-01-04 NOTE — Telephone Encounter (Signed)
Dylan Chapman is still sick-sometimes viral illnesses can persist sometimes people can get secondary illness very difficult to tell via messaging.  Recommend office visit for Friday.  Please assist.  Thanks-Dr. [name]

## 2022-01-05 ENCOUNTER — Ambulatory Visit (INDEPENDENT_AMBULATORY_CARE_PROVIDER_SITE_OTHER): Payer: Medicare HMO | Admitting: Family Medicine

## 2022-01-05 VITALS — BP 126/68 | HR 72 | Temp 98.1°F | Wt 154.0 lb

## 2022-01-05 DIAGNOSIS — R059 Cough, unspecified: Secondary | ICD-10-CM | POA: Diagnosis not present

## 2022-01-05 DIAGNOSIS — J029 Acute pharyngitis, unspecified: Secondary | ICD-10-CM

## 2022-01-05 DIAGNOSIS — R058 Other specified cough: Secondary | ICD-10-CM

## 2022-01-05 MED ORDER — BENZONATATE 100 MG PO CAPS: 100.0000 mg | ORAL_CAPSULE | Freq: Three times a day (TID) | ORAL | 0 refills | Status: DC | PRN

## 2022-01-05 NOTE — Patient Instructions (Signed)
Hi Dylan Chapman It was good to see you today Your strep test was negative Your lungs sounded clear I would recommend giving this another 2 to 3 weeks to clear up  If you start having increased coughing shortness of breath fever chills or other problems we need to move sooner  Let us know if things are changing or if things do not clear up over the next 3 weeks  Tessalon is a cough medication that does not cause drowsiness that can help in some cases settle the cough  If things change let me know  [facility]

## 2022-01-05 NOTE — Progress Notes (Unsigned)
   Subjective:    Patient ID: Dylan Chapman, male    DOB: [birth year], 71 y.o.   MRN: [id number]  HPI Previously patient was diagnosed with COVID in August.  He was able to make it through this reasonably well.  Has had residual coughing with some drainage.  No shortness of breath no high fever no sweats or chills.  Past couple days developed redness in the throat with soreness no other acute findings   Review of Systems     Objective:   Physical Exam Gen-NAD not toxic TMS-normal bilateral throat is erythematous but there is no exudates noted T- normal mild redness Chest-CTA respiratory rate normal no crackles no respiratory distress CV RRR no murmur Skin-warm dry Neuro-grossly normal        Assessment & Plan:  1. Cough, unspecified type Do not detect any pneumonia on physical exam  2. Sore throat Rapid strep test is negative.  No testing evidence of strep  3. Post-viral cough syndrome Currently I would not recommend chest x-ray but if his symptoms do not go away over the course the next 3 weeks would recommend consideration of chest x-ray.  Hold off on any antibiotics currently unless there is a clinical decline.  If fevers increased difficulty of breathing or significant decline in clinical situation patient needs to reach back out to [place] for further intervention and possible medications.  Would not consider him to be in a highly contagious range this far out from original illness with COVID.

## 2022-01-09 ENCOUNTER — Ambulatory Visit (INDEPENDENT_AMBULATORY_CARE_PROVIDER_SITE_OTHER): Payer: Medicare HMO | Admitting: Neurology

## 2022-01-09 ENCOUNTER — Ambulatory Visit (INDEPENDENT_AMBULATORY_CARE_PROVIDER_SITE_OTHER): Payer: Medicare HMO | Admitting: Family Medicine

## 2022-01-09 ENCOUNTER — Ambulatory Visit (HOSPITAL_COMMUNITY)
Admission: RE | Admit: 2022-01-09 | Discharge: 2022-01-09 | Disposition: A | Discharge status: Home / Self Care | Payer: Medicare HMO | Source: Ambulatory Visit | Attending: Family Medicine | Admitting: Family Medicine

## 2022-01-09 VITALS — BP 120/62 | HR 70 | Temp 97.9°F | Ht 67.0 in | Wt 154.0 lb

## 2022-01-09 DIAGNOSIS — M25561 Pain in right knee: Secondary | ICD-10-CM | POA: Insufficient documentation

## 2022-01-09 DIAGNOSIS — R0683 Snoring: Secondary | ICD-10-CM | POA: Diagnosis not present

## 2022-01-09 DIAGNOSIS — G43019 Migraine without aura, intractable, without status migrainosus: Secondary | ICD-10-CM

## 2022-01-09 DIAGNOSIS — G44029 Chronic cluster headache, not intractable: Secondary | ICD-10-CM

## 2022-01-09 DIAGNOSIS — R519 Headache, unspecified: Secondary | ICD-10-CM

## 2022-01-09 MED ORDER — DICLOFENAC SODIUM 50 MG PO TBEC
50.0000 mg | DELAYED_RELEASE_TABLET | Freq: Two times a day (BID) | ORAL | 0 refills | Status: DC
Start: 2022-01-09 — End: 2022-02-22

## 2022-01-09 NOTE — Progress Notes (Signed)
   Subjective:    Patient ID: Dylan Chapman, male    DOB: Feb 15, 1951, 71 y.o.   MRN: [id number]  HPI  Right Knee pain for several months intermittently , pain wakes him at at night at times Knee pain and discomfort in the medial aspect of the knee.  Recently he did a lot of hiking out [place] did a lot of training to get ready for it has intermittent pain does not have any swelling denies it locking denies it giving way states energy level doing okay Review of Systems     Objective:   Physical Exam Read some COVID recent illness had some residual coughing currently lungs are clear minimal cough occasional to the patient  Ligaments are stable bilateral there is tenderness in the right knee medial aspect no click is felt no laxity       Assessment & Plan:  Knee pain recommend anti-inflammatories over the course of the next 2 weeks if not significant improvement over the next 2 weeks to let us know X-rays ordered   Patient also has dermatitis that he uses triamcinolone for he would like to have a refill specifically of the amount that [facility] gave him we will call [facility] and authorize a refill-this will occur tomorrow with the nurses-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

## 2022-01-10 ENCOUNTER — Other Ambulatory Visit: Payer: Self-pay

## 2022-01-10 MED ORDER — TRIAMCINOLONE ACETONIDE 0.1 % EX CREA: 1.0000 | TOPICAL_CREAM | Freq: Two times a day (BID) | CUTANEOUS | 0 refills | Status: DC | PRN

## 2022-01-10 NOTE — Progress Notes (Signed)
Pharmacy contacted per previous notes and verified amount previously sent .

## 2022-01-15 DIAGNOSIS — K4090 Unilateral inguinal hernia, without obstruction or gangrene, not specified as recurrent: Secondary | ICD-10-CM | POA: Diagnosis not present

## 2022-01-19 NOTE — Procedures (Signed)
[facility] POLYSOMNOGRAPHY  INTERPRETATION REPORT   STUDY DATE:  01/09/2022     PATIENT NAME:  Dylan Chapman         DATE OF BIRTH:  Oct 27, 1950  PATIENT ID:  [id number]    TYPE OF STUDY:  PSG  READING PHYSICIAN: Larey Seat, MD REFERRED BY: Genia Harold, MD SCORING TECH: Forde Radon, RPSGT   HISTORY:  This patient of Dr Georgina Peer was referred for a sleep consultation on 11-15-2021. Dylan Chapman is a 71 year old widower with early morning headaches and headaches for years. He stated the onset of migraines at age 73, severe cluster- form HA, Imitrex takes too long to have effect, Cluster HA did better when he used oxygen until 2003 when this headache form became rare. Has tried Aimovig but couldn't tolerate the constipation. He reports also snoring, 2-3 nocturia breaks at night and waking with headache. He reports his sleep as refreshing, no need for daytime naps.  Has lipidemia, GERD, AA ectasia.  ADDITIONAL INFORMATION:  The Epworth Sleepiness Scale was endorsed at 1 /24 points (scores above or equal to 10 are suggestive of hypersomnolence). FSS endorsed at 8 /63 points. GDS at 1/ 15 points.   Height: 67 in Weight: 150 lb (BMI 23) Neck Size: 15 in  MEDICATIONS: Lipitor, Ketoprofen powder, Multivitamin, Kenalog  TECHNICAL DESCRIPTION: A registered sleep technologist (RPSGT)  was in attendance for the duration of the recording.  Data collection, scoring, video monitoring, and reporting were performed in compliance with the AASM Manual for the Scoring of Sleep and Associated Events; (Hypopnea is scored based on the criteria listed in Section VIII D. 1b in the AASM Manual V2.6 using a 4% oxygen desaturation rule or Hypopnea is scored based on the criteria listed in Section VIII D. 1a in the AASM Manual V2.6 using 3% oxygen desaturation and /or arousal rule).   SLEEP CONTINUITY AND SLEEP ARCHITECTURE:  Lights-out was at 21:38: and lights-on at  05:07:, with  7.5  minutes of recording time . Total sleep time (TST) was 339.0 minutes with a decreased sleep efficiency at 75.6%. There was 19.6% REM sleep.  BODY POSITION:  TST was divided between the following sleep positions:  Duration of total sleep and percent of total sleep in their respective position is as follows: supine 20 minutes (6%), non-supine 319 minutes (94%); right 97 minutes (29%), left 222 minutes (65%), and prone 00 minutes (0%). Total supine REM sleep time was 00 minutes (0% of total REM sleep). Sleep latency was normal at 7.0 minutes.  REM sleep latency was normal at 58.0 minutes. Of the total sleep time, the percentage of stage N1 sleep was 1.6%, stage N2 sleep was 79%, stage N3 sleep was 0.0%, and REM sleep was 19.6%. Wake after sleep onset (WASO) time accounted for 102 minutes. There were 5 Stage R periods observed on this study night, 19 awakenings (i.e. transitions to Stage W from any sleep stage), and 51 total stage transitions.   RESPIRATORY MONITORING:   Based on CMS criteria (using a 4% oxygen desaturation rule), there were 15 apneas (15 obstructive; 0 central; 0 mixed), and 15 hypopneas.  The Apnea index was 2.7/h. The Hypopnea index was 2.7/h. The AHI, or total apnea-hypopnea index was 5.3/h overall. Supine AHI was 3.0/h, Non-Supine AHI was 6.3/h. REM AHI was 6.3/h and NREM AHI was 5/h.  There were 0 respiratory effort-related arousals (RERAs).   OXIMETRY: Oxyhemoglobin Saturation Nadir during sleep was at  85%, down from  a mean of 94%.  Of the Total sleep time (TST), hypoxemia (<89%) was present for 0.5 minutes, or 0.2% of total sleep time.  LIMB MOVEMENTS: There were 0 periodic limb movements of sleep (0.0/h) AROUSAL: There were 28 arousals in total.  Of these, 7 were identified as respiratory-related arousals (1 /h), 0 were PLM-related arousals (0 /h), and 21 were non-specific arousals (4 /h). Snoring was classified as mild. EEG:  PSG EEG was of normal amplitude and frequency, with  symmetric manifestation of sleep stages. EKG: The electrocardiogram documented NSR and rare PVCs.  The average heart rate during sleep was 63 bpm.  The heart rate during sleep varied between a minimum of  55 and  a maximum of  85 bpm. AUDIO and VIDEO: No vocalization, complex movement or sleep activity was captured. There were no rhythmic or tic like movements, nor myoclonus.   IMPRESSION: Sleep disordered breathing was present. Obstructive Sleep apnea was seen at a mild degree, associated with mild snoring and clinically insignificant oxygen desaturation.    2) Total sleep time was reduced at 339.0 minutes.  Sleep efficiency was decreased at 75.6%.   Sleep fragmentation was noted- The majority of sleep arousals was spontaneous.   RECOMMENDATIONS: Hypopneas and apneas were more frequently and prolonged seen in REM sleep and should be treated with Positive airway pressure therapy if at all.  CPAP is optional but no other treatment form (inspire or dental device) has a chance to control REM sleep hypopnea / apnea.  If Dylan Chapman agrees, he will start treatment with an auto-titration CPAP device, RESMED or Respironics, 5- 12 cm water pressure, 2 cm EPR, heated humidification, nasal interface and follow up after 30 - 90 days of therapy.    Larey Seat, MD Board certified by the [facility] and ABPN.

## 2022-01-19 NOTE — Addendum Note (Signed)
Addended by: Larey Seat on: 01/19/2022 04:53 PM   Modules accepted: Orders

## 2022-01-20 DIAGNOSIS — Z0100 Encounter for examination of eyes and vision without abnormal findings: Secondary | ICD-10-CM | POA: Diagnosis not present

## 2022-01-22 ENCOUNTER — Encounter: Payer: Self-pay | Admitting: Neurology

## 2022-01-23 NOTE — Telephone Encounter (Signed)
-----   Message from Larey Seat, MD sent at 01/19/2022  4:53 PM EDT ----- IMPRESSION: 1. Sleep disordered breathing was present. Obstructive Sleep apnea was seen at a mild degree, associated withmild snoring and clinically insignificant oxygen desaturation.  2) Total sleep time was reducedat 339.16mnutes. Sleep efficiency was decreasedat 75.6%. Sleep fragmentationwas noted- The majority of sleep arousals was spontaneous.   RECOMMENDATIONS: Hypopneas and apneas were more frequently and prolonged seen in REM sleep and should be treated with Positive airway pressure therapy if at all.  CPAP is optional but no other treatment form (inspire or dental device) has a chance to control REM sleep hypopnea / apnea.  If Mr. SMortensenagrees, he will start treatment with an auto-titration CPAP device, RESMED or Respironics, 5- 12 cm water pressure, 2 cm EPR, heated humidification, nasal interface and follow up after 30 - 90 days of therapy.

## 2022-01-23 NOTE — Telephone Encounter (Signed)
Called patient to discuss sleep study results. No answer at this time. LVM for the patient to call back.  Will send a mychart message as well. 

## 2022-02-10 ENCOUNTER — Encounter: Payer: Self-pay | Admitting: Family Medicine

## 2022-02-10 DIAGNOSIS — M25561 Pain in right knee: Secondary | ICD-10-CM

## 2022-02-12 NOTE — Telephone Encounter (Signed)
Nurses I read over Dylan Chapman's updated message regarding his knee Neck step is to go ahead with referral to orthopedics Please go ahead and initiate referral to [facility]

## 2022-02-13 ENCOUNTER — Ambulatory Visit: Payer: Medicare HMO | Admitting: Gastroenterology

## 2022-02-22 ENCOUNTER — Encounter: Payer: Self-pay | Admitting: Gastroenterology

## 2022-02-22 ENCOUNTER — Ambulatory Visit (INDEPENDENT_AMBULATORY_CARE_PROVIDER_SITE_OTHER): Payer: Medicare HMO | Admitting: Gastroenterology

## 2022-02-22 VITALS — BP 122/77 | HR 61 | Temp 98.3°F | Ht 67.0 in | Wt 159.2 lb

## 2022-02-22 DIAGNOSIS — K2100 Gastro-esophageal reflux disease with esophagitis, without bleeding: Secondary | ICD-10-CM

## 2022-02-22 DIAGNOSIS — Z86010 Personal history of colonic polyps: Secondary | ICD-10-CM | POA: Diagnosis not present

## 2022-02-22 DIAGNOSIS — Z8719 Personal history of other diseases of the digestive system: Secondary | ICD-10-CM

## 2022-02-22 NOTE — Progress Notes (Signed)
Gastroenterology Office Note     Primary Care Physician:  Kathyrn Drown, MD  Primary Gastroenterologist:   Chief Complaint   Chief Complaint  Patient presents with   Follow-up    Doing well.      History of Present Illness   Dylan Chapman is a 71 y.o. male presenting today in follow-up with a history of GERD, tubular adenomas, rectal bleeding in the past.  Colonoscopy updated in interim from last visit with adenomas; next surveillance in 2028.  No abdominal pain, N/V, changes in bowel habits, constipation, diarrhea, overt GI bleeding, GERD, dysphagia, unexplained weight loss, lack of appetite, unexplained weight gain.   He [place] in [place]. Had a great time. Active. Plans for another trip next year for hiking elsewhere.     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 Gala Romney: normal rectum and scattered sigmoid diverticulum    COLONOSCOPY N/A 10/09/2016   Dr. Gala Romney: non-bleeding internal hemorrhoids, diverticulosis in sigmoid and descending colon, one 4 mm tubular adenoma at recto-sigmoid s/p cold snare removal. Surveillance in 7 years   COLONOSCOPY WITH PROPOFOL N/A 08/14/2021   sigmoid and descending colon diverticulosis, two 4-6 mm polyps, tubular adenomas.   ESOPHAGOGASTRODUODENOSCOPY N/A 10/09/2016   Dr. Gala Romney: LA Grade A esophagitis, normal stomach, normal ampulla and second portion of duodenum, no specimens collected   HERNIA REPAIR Left    left inguinal   LAMINECTOMY     MALONEY DILATION N/A 10/09/2016   Procedure: Venia Minks DILATION;  Surgeon: Daneil Dolin, MD;  [facility];  Service: Endoscopy;  Laterality:  N/A;   POLYPECTOMY  08/14/2021   Procedure: POLYPECTOMY;  Surgeon: Daneil Dolin, MD;  Location: [facility];  Service: Endoscopy;;   SHOULDER SURGERY      Current Outpatient Medications  Medication Sig Dispense Refill   atorvastatin (LIPITOR) 80 MG tablet Take 1 tablet (80 mg total) by mouth daily. 90 tablet 3   Ketoprofen POWD AS DIRECTED. (Patient taking differently: Take 1 capsule by mouth daily as needed (migraines.). Ketoprofen 12.5-mg/Riboflavin 100-mg/Caffeine 65-mg) 60 g 3   Multiple Vitamin (MULTIVITAMIN WITH MINERALS) TABS tablet Take 1 tablet by mouth in the morning.     triamcinolone cream (KENALOG) 0.1 % Apply 1 Application topically 2 (two) times daily as needed (skin irritation/rash (under arms)). 240 g 0   No current facility-administered medications for this visit.    Allergies as of 02/22/2022 - Review Complete 02/22/2022  Allergen Reaction Noted   Penicillins Hives 06/28/2012    Family History  Problem Relation Age of Onset   Heart disease Father    Prostate cancer Father    Migraines Daughter    Colon cancer Neg Hx    Colon polyps Neg Hx     Social History   Socioeconomic History   Marital status: Widowed    Spouse name: Not on file   Number of children: 2   Years of education: Not on file   Highest education level: Bachelor's degree (e.g., BA, AB, BS)  Occupational History   Occupation: self-employed  Comment: [employer]   Tobacco Use   Smoking status: Former    Types: Cigarettes   Smokeless tobacco: Never   Tobacco comments:    Quit age 76  Vaping Use   Vaping Use: Never used  Substance and Sexual Activity   Alcohol use: Yes    Alcohol/week: 2.0 standard drinks of alcohol    Types: 2 Glasses of wine per week    Comment: 3 glasses wine/week; occ beer   Drug use: No   Sexual activity: Not Currently  Other Topics Concern   Not on file  Social History Narrative   Lives alone    2 daughters   Social Determinants of Health    Financial Resource Strain: Low Risk  (12/24/2020)   Overall Financial Resource Strain (CARDIA)    Difficulty of Paying Living Expenses: Not hard at all  Food Insecurity: No Food Insecurity (12/24/2020)   Hunger Vital Sign    Worried About Running Out of Food in the Last Year: Never true    Ran Out of Food in the Last Year: Never true  Transportation Needs: No Transportation Needs (12/24/2020)   PRAPARE - Hydrologist (Medical): No    Lack of Transportation (Non-Medical): No  Physical Activity: Inactive (12/24/2020)   Exercise Vital Sign    Days of Exercise per Week: 0 days    Minutes of Exercise per Session: 0 min  Stress: No Stress Concern Present (12/24/2020)   [place]    Feeling of Stress : Not at all  Social Connections: Moderately Integrated (12/24/2020)   Social Connection and Isolation Panel [NHANES]    Frequency of Communication with Friends and Family: More than three times a week    Frequency of Social Gatherings with Friends and Family: More than three times a week    Attends Religious Services: More than 4 times per year    Active Member of [employer] or Organizations: Yes    Attends Archivist Meetings: More than 4 times per year    Marital Status: Widowed  Intimate Partner Violence: Not At Risk (12/24/2020)   Humiliation, Afraid, Rape, and Kick questionnaire    Fear of Current or Ex-Partner: No    Emotionally Abused: No    Physically Abused: No    Sexually Abused: No     Review of Systems   Gen: Denies any fever, chills, fatigue, weight loss, lack of appetite.  CV: Denies chest pain, heart palpitations, peripheral edema, syncope.  Resp: Denies shortness of breath at rest or with exertion. Denies wheezing or cough.  GI: Denies dysphagia or odynophagia. Denies jaundice, hematemesis, fecal incontinence. GU : Denies urinary burning, urinary frequency, urinary hesitancy MS:  Denies joint pain, muscle weakness, cramps, or limitation of movement.  Derm: Denies rash, itching, dry skin Psych: Denies depression, anxiety, memory loss, and confusion Heme: Denies bruising, bleeding, and enlarged lymph nodes.   Physical Exam   BP 122/77 (BP Location: Right Arm, Patient Position: Sitting, Cuff Size: Normal)   Pulse 61   Temp 98.3 F (36.8 C) (Oral)   Ht '5\' 7"'$  (1.702 m)   Wt 159 lb 3.2 oz (72.2 kg)   SpO2 97%   BMI 24.93 kg/m  General:   Alert and oriented. Pleasant and cooperative. Well-nourished and well-developed.  Head:  Normocephalic and atraumatic. Eyes:  Without icterus Abdomen:  +BS, soft, non-tender and non-distended. No HSM noted. No guarding or rebound. No masses  appreciated.  Rectal:  Deferred  Msk:  Symmetrical without gross deformities. Normal posture. Extremities:  Without edema. Neurologic:  Alert and  oriented x4;  grossly normal neurologically. Skin:  Intact without significant lesions or rashes. Psych:  Alert and cooperative. Normal mood and affect.   Assessment   Dylan Chapman is a 72 y.o. male presenting today in follow-up with a history of  GERD, tubular adenomas, rectal bleeding in the past.  GERD: no issues now, no history of Barrett's. No PPI needed.   Adenomas: due for surveillance in 2028.  Rectal bleeding: no issues for some time. Benign anorectal source.    We are not managing any chronic medications; we will see him back as needed.    PLAN    Return prn Colonoscopy 2028 Call if concerns   Annitta Needs, PhD, [facility] Gastroenterology

## 2022-02-22 NOTE — Patient Instructions (Signed)
I am glad you are doing well!  Please call with any concerns or questions.  Enjoy your next hiking adventure!  We will see you back as needed, and your next colonoscopy will be due in 2028!  I enjoyed seeing you again today! As you know, I value our relationship and want to provide genuine, compassionate, and quality care. I welcome your feedback. If you receive a survey regarding your visit,  I greatly appreciate you taking time to fill this out. See you next time!  Annitta Needs, PhD, ANP-BC [facility] Gastroenterology

## 2022-03-06 DIAGNOSIS — M25561 Pain in right knee: Secondary | ICD-10-CM | POA: Diagnosis not present

## 2022-03-07 ENCOUNTER — Encounter: Payer: Self-pay | Admitting: Psychiatry

## 2022-03-07 ENCOUNTER — Ambulatory Visit: Payer: Medicare HMO | Admitting: Psychiatry

## 2022-03-07 VITALS — BP 120/70 | HR 72 | Ht 67.0 in | Wt 158.0 lb

## 2022-03-07 DIAGNOSIS — G43019 Migraine without aura, intractable, without status migrainosus: Secondary | ICD-10-CM | POA: Diagnosis not present

## 2022-03-07 NOTE — Progress Notes (Signed)
   CC:  headaches  Follow-up Visit  Last visit: 09/04/21  Brief HPI: 71 year old male with a history of cluster headaches, HLD, hypothyroidism, ectatic abdominal aorta who follows in clinic for migraines.   At his last visit he was started in [place] for migraine prevention. He was referred for sleep evaluation.  Interval History: Headaches did improve by 50% after his first injection of Aimovig. He went from 17 per month to 8 per month. However he did experience constipation and felt the medication was too expensive. He is frustrated that his headaches have not resolved and does not wish to pursue any further treatment at this time.   Sleep study showed mild sleep apnea, which was not felt to be significant enough to warrant a CPAP.   Current Headache Regimen: Preventative: none Abortive: ketoprofen, B2, caffeine   Prior Therapies                                  Rescue: Imitrex - took too long to start working Ketoprofen B2  Preventative: Propranolol Gabapentin Topamax Qulipta - copay was too expensive Aimovig 140 mg monthly - constipation, expensive O2  Physical Exam:   Vital Signs: BP 120/70   Pulse 72   Ht '5\' 7"'$  (1.702 m)   Wt 158 lb (71.7 kg)   BMI 24.75 kg/m  GENERAL:  well appearing, in no acute distress, alert  SKIN:  Color, texture, turgor normal. No rashes or lesions HEAD:  Normocephalic/atraumatic. RESP: normal respiratory effort MSK:  No gross joint deformities.   NEUROLOGICAL: Mental Status: Alert, oriented to person, place and time, Follows commands, and Speech fluent and appropriate. Cranial Nerves: PERRL, face symmetric, no dysarthria, hearing grossly intact Motor: moves all extremities equally Gait: normal-based.  IMPRESSION: 71 year old male with a history of cluster headaches, HLD, hypothyroidism, ectatic abdominal aorta who presents for follow up of migraines. He did have improvement in his migraines by 50% with Aimovig, but does not  believe the cost and side effects of the medication are worth the benefits. Discussed alternate CGRPs which are less likely to cause constipation. However he is not interested in starting further treatment unless it can guarantee resolution of his headaches. Discussed how we cannot determine how he will respond to medication without trying it first, and he has decided that he would prefer not to try any further medications at this time. He will return to clinic if he chooses to pursue headache treatment in the future.  PLAN: -Patient has decided not to pursue headache treatment at this time -Next steps: consider Ajovy/Emgality, SNRI, Botox  Follow-up: as needed  I spent a total of 31 minutes on the date of the service. Headache education was done. Discussed treatment options including preventive  medications and supplements. Written educational materials and patient instructions outlining all of the above were given.  Genia Harold, MD 03/07/22 3:48 PM

## 2022-03-07 NOTE — Patient Instructions (Signed)
Natural supplements that can reduce migraines: Magnesium Oxide or Magnesium Glycinate 500 mg at bed (up to 800 mg daily) Coenzyme Q10 300 mg in AM Vitamin B2- 200 mg twice a day  Add 1 supplement at a time since even natural supplements can have undesirable side effects. You can sometimes buy supplements cheaper (especially Coenzyme Q10) at [email or web address]-[email or web address] or at [employer].  Magnesium: Magnesium (250 mg twice a day or 500 mg at bed) has a relaxant effect on smooth muscles such as blood vessels. Individuals suffering from frequent or daily headache usually have low magnesium levels which can be increase with daily supplementation of 400-800 mg. Three trials found 40-90% average headache reduction  when used as a preventative. Magnesium also demonstrated the benefit in menstrually related migraine.  Magnesium is part of the messenger system in the serotonin cascade and it is a good muscle relaxant.  It is also useful for constipation. Good sources include nuts, whole grains, and tomatoes. Side Effects: loose stool/diarrhea  Riboflavin (vitamin B 2) 200 mg twice a day. This vitamin assists nerve cells in the production of ATP a principal energy storing molecule.  It is necessary for many chemical reactions in the body.  There have been at least 3 clinical trials of riboflavin using 400 mg per day all of which suggested that migraine frequency can be decreased.  All 3 trials showed significant improvement in over half of migraine sufferers.  The supplement is found in bread, cereal, milk, meat, and poultry.  Most Americans get more riboflavin than the recommended daily allowance, however riboflavin deficiency is not necessary for the supplements to help prevent headache. Side effects: energizing, green urine  Coenzyme Q10: This is present in almost all cells in the body and is critical component for the conversion of energy.  Recent studies have shown that a nutritional supplement of CoQ10 can reduce the  frequency of migraine attacks by improving the energy production of cells as with riboflavin.  Doses of 150 mg twice a day have been shown to be effective.

## 2022-03-22 ENCOUNTER — Ambulatory Visit (INDEPENDENT_AMBULATORY_CARE_PROVIDER_SITE_OTHER): Payer: Medicare HMO | Admitting: Family Medicine

## 2022-03-22 VITALS — BP 124/79 | HR 65 | Temp 97.3°F | Ht 67.0 in | Wt 159.0 lb

## 2022-03-22 DIAGNOSIS — R6889 Other general symptoms and signs: Secondary | ICD-10-CM | POA: Diagnosis not present

## 2022-03-22 NOTE — Progress Notes (Signed)
   Subjective:    Patient ID: Dylan Chapman, male    DOB: Aug 13, 1950, 71 y.o.   MRN: [id number]  HPI Exposure to covid - currently sore throat slightly Very nice patient Unfortunately had some issues with sore throat not feeling good for couple days now feeling better In addition to all of this energy level subpar Start to feel better today Did home COVID test that was negative did 1 this morning it was negative Tuesday am started w sore throat, feeling better with slight dry cough   Review of Systems     Objective:   Physical Exam Gen-NAD not toxic TMS-normal bilateral T- normal no redness Chest-CTA respiratory rate normal no crackles CV RRR no murmur Skin-warm dry Neuro-grossly normal        Assessment & Plan:   Viral-like illness Flulike illness Starting to improve Going to be doing travel in about 8 days so therefore do PCR testing Await the findings of the triple swab Obviously if positive COVID this will rearrange his travel plans

## 2022-03-23 ENCOUNTER — Other Ambulatory Visit: Payer: Self-pay | Admitting: Family Medicine

## 2022-03-23 LAB — COVID-19, FLU A+B AND RSV
Influenza A, NAA: NOT DETECTED
Influenza B, NAA: NOT DETECTED
RSV, NAA: NOT DETECTED
SARS-CoV-2, NAA: DETECTED — AB

## 2022-03-23 MED ORDER — NIRMATRELVIR/RITONAVIR (PAXLOVID)TABLET: 3.0000 | ORAL_TABLET | Freq: Two times a day (BID) | ORAL | 0 refills | Status: AC

## 2022-04-01 ENCOUNTER — Encounter: Payer: Self-pay | Admitting: Family Medicine

## 2022-04-11 DIAGNOSIS — L538 Other specified erythematous conditions: Secondary | ICD-10-CM | POA: Diagnosis not present

## 2022-04-11 DIAGNOSIS — L821 Other seborrheic keratosis: Secondary | ICD-10-CM | POA: Diagnosis not present

## 2022-04-11 DIAGNOSIS — L814 Other melanin hyperpigmentation: Secondary | ICD-10-CM | POA: Diagnosis not present

## 2022-04-11 DIAGNOSIS — L570 Actinic keratosis: Secondary | ICD-10-CM | POA: Diagnosis not present

## 2022-04-11 DIAGNOSIS — L218 Other seborrheic dermatitis: Secondary | ICD-10-CM | POA: Diagnosis not present

## 2022-04-11 DIAGNOSIS — D492 Neoplasm of unspecified behavior of bone, soft tissue, and skin: Secondary | ICD-10-CM | POA: Diagnosis not present

## 2022-04-11 DIAGNOSIS — D225 Melanocytic nevi of trunk: Secondary | ICD-10-CM | POA: Diagnosis not present

## 2022-04-24 ENCOUNTER — Encounter: Payer: Self-pay | Admitting: Family Medicine

## 2022-04-24 NOTE — Telephone Encounter (Signed)
Nurses Please communicate with Dylan Chapman Very difficult to know for certain based upon the issues he is having Visit possible this could be a virus that is just working its way out Some of the symptoms could be related to recent vaccine But cannot rule out the possibility of worsening condition such as lung infection etc.  I would recommend being seen to be on the safe side.  We can work him in at the end of the day for 15 or 4 PM tomorrow afternoon  Please touch base with [name] and communicate the above  Thanks-Dr. [name]

## 2022-04-25 ENCOUNTER — Encounter: Payer: Self-pay | Admitting: Family Medicine

## 2022-04-25 ENCOUNTER — Ambulatory Visit (INDEPENDENT_AMBULATORY_CARE_PROVIDER_SITE_OTHER): Payer: Medicare HMO | Admitting: Family Medicine

## 2022-04-25 VITALS — BP 127/85 | HR 73 | Temp 98.8°F | Ht 67.0 in | Wt 160.0 lb

## 2022-04-25 DIAGNOSIS — J019 Acute sinusitis, unspecified: Secondary | ICD-10-CM | POA: Diagnosis not present

## 2022-04-25 DIAGNOSIS — I77811 Abdominal aortic ectasia: Secondary | ICD-10-CM | POA: Diagnosis not present

## 2022-04-25 MED ORDER — DOXYCYCLINE HYCLATE 100 MG PO TABS: 100.0000 mg | ORAL_TABLET | Freq: Two times a day (BID) | ORAL | 0 refills | Status: DC

## 2022-04-25 NOTE — Progress Notes (Signed)
   Subjective:    Patient ID: Dylan Chapman, male    DOB: Aug 28, 1950, 72 y.o.   MRN: [id number]  HPI Coughing some green congestion, sore throat , runny nose , intermittent headaches symptoms started 12/26 - had 3 vaccines  12/30 Patient had received vaccines just recently Before that he had viral-like illness for multiple days and seem to get better a little bit but then he felt bad after the vaccines but then started to feel better but then felt worse with coughing up discolored phlegm Review of Systems     Objective:   Physical Exam  Lungs clear heart regular HEENT benign      Assessment & Plan:  Viral syndrome to begin with lead to secondary infection I do not feel that patient has active flu RSV or COVID would not recommend triple swab testing at the moment antibiotics prescribed for sinus infection warning signs discussed with patient follow-up if progressive troubles or worse Patient also has aorta that had ultrasound completed earlier last year no need for repeat based on that study

## 2022-05-04 DIAGNOSIS — E7849 Other hyperlipidemia: Secondary | ICD-10-CM | POA: Diagnosis not present

## 2022-05-04 DIAGNOSIS — E038 Other specified hypothyroidism: Secondary | ICD-10-CM | POA: Diagnosis not present

## 2022-05-04 DIAGNOSIS — Z0000 Encounter for general adult medical examination without abnormal findings: Secondary | ICD-10-CM | POA: Diagnosis not present

## 2022-05-04 DIAGNOSIS — Z125 Encounter for screening for malignant neoplasm of prostate: Secondary | ICD-10-CM | POA: Diagnosis not present

## 2022-05-05 LAB — LIPID PANEL
Chol/HDL Ratio: 3.1 ratio (ref 0.0–5.0)
Cholesterol, Total: 170 mg/dL (ref 100–199)
HDL: 54 mg/dL (ref >39)
LDL Chol Calc (NIH): 102 mg/dL — ABNORMAL HIGH (ref 0–99)
Triglycerides: 71 mg/dL (ref 0–149)
VLDL Cholesterol Cal: 14 mg/dL (ref 5–40)

## 2022-05-05 LAB — HEPATIC FUNCTION PANEL
ALT: 22 IU/L (ref 0–44)
AST: 26 IU/L (ref 0–40)
Albumin: 4.2 g/dL (ref 3.9–4.9)
Alkaline Phosphatase: 102 IU/L (ref 44–121)
Bilirubin Total: 0.6 mg/dL (ref 0.0–1.2)
Bilirubin, Direct: 0.16 mg/dL (ref 0.00–0.40)
Total Protein: 6.8 g/dL (ref 6.0–8.5)

## 2022-05-05 LAB — PSA: Prostate Specific Ag, Serum: 4.8 ng/mL — ABNORMAL HIGH (ref 0.0–4.0)

## 2022-05-05 LAB — BASIC METABOLIC PANEL
BUN/Creatinine Ratio: 20 (ref 10–24)
BUN: 24 mg/dL (ref 8–27)
CO2: 23 mmol/L (ref 20–29)
Calcium: 9.9 mg/dL (ref 8.6–10.2)
Chloride: 102 mmol/L (ref 96–106)
Creatinine, Ser: 1.22 mg/dL (ref 0.76–1.27)
Glucose: 85 mg/dL (ref 70–99)
Potassium: 4.7 mmol/L (ref 3.5–5.2)
Sodium: 140 mmol/L (ref 134–144)
eGFR: 64 mL/min/{1.73_m2} (ref >59)

## 2022-05-05 LAB — T3: T3, Total: 103 ng/dL (ref 71–180)

## 2022-05-05 LAB — T4, FREE: Free T4: 1.1 ng/dL (ref 0.82–1.77)

## 2022-05-05 LAB — TSH: TSH: 6.93 u[IU]/mL — ABNORMAL HIGH (ref 0.450–4.500)

## 2022-05-10 ENCOUNTER — Ambulatory Visit (INDEPENDENT_AMBULATORY_CARE_PROVIDER_SITE_OTHER): Payer: Medicare HMO | Admitting: Family Medicine

## 2022-05-10 ENCOUNTER — Telehealth: Payer: Self-pay

## 2022-05-10 VITALS — BP 120/72 | HR 69 | Temp 98.7°F | Ht 67.5 in | Wt 159.0 lb

## 2022-05-10 DIAGNOSIS — Z0001 Encounter for general adult medical examination with abnormal findings: Secondary | ICD-10-CM

## 2022-05-10 DIAGNOSIS — E7849 Other hyperlipidemia: Secondary | ICD-10-CM | POA: Diagnosis not present

## 2022-05-10 DIAGNOSIS — E038 Other specified hypothyroidism: Secondary | ICD-10-CM | POA: Diagnosis not present

## 2022-05-10 DIAGNOSIS — Z0000 Encounter for general adult medical examination without abnormal findings: Secondary | ICD-10-CM

## 2022-05-10 DIAGNOSIS — R972 Elevated prostate specific antigen [PSA]: Secondary | ICD-10-CM

## 2022-05-10 MED ORDER — ATORVASTATIN CALCIUM 80 MG PO TABS: 80.0000 mg | ORAL_TABLET | Freq: Every day | ORAL | 3 refills | Status: DC

## 2022-05-10 NOTE — Telephone Encounter (Signed)
Per [facility]:  Ketoprofen, [place], Caffeine  12.5/100/'65mg'$ 

## 2022-05-10 NOTE — Telephone Encounter (Signed)
Spoke with [employer] pharmacist for the Ketoprofen. I was informed that they can no longer accept compound RX over the phone they need a written out RX for audit purposes. The fax number is [phone].

## 2022-05-10 NOTE — Telephone Encounter (Signed)
If possible please work with the pharmacist to find out exactly it should be written so that it can be filled in the way that he takes it-in other words I do not know what the compound is that he is getting in regards to specifics so that they can guide you specifically how to write it please do so write it out then I will sign

## 2022-05-10 NOTE — Progress Notes (Signed)
Subjective:    Patient ID: Dylan Chapman, male    DOB: 1951-01-23, 72 y.o.   MRN: [id number]  HPI AWV- Annual Wellness Visit Here today for wellness physical as well Overall doing well Staying physically active Tries to eat healthy for the most part Denies any chest tightness pressure pain shortness of breath Does have history of hyperlipidemia LDL near goal he is already at top of Lipitor. The patient was seen for their annual wellness visit. The patient's past medical history, surgical history, and family history were reviewed. Pertinent vaccines were reviewed ( tetanus, pneumonia, shingles, flu) The patient's medication list was reviewed and updated.  The height and weight were entered.  BMI recorded in electronic record elsewhere  Cognitive screening was completed. Outcome of Mini - Cog: Passes   Falls /depression screening electronically recorded within record elsewhere  Current tobacco usage:no (All patients who use tobacco were given written and verbal information on quitting)  Recent listing of emergency department/hospitalizations over the past year were reviewed.  current specialist the patient sees on a regular basis: no   Medicare annual wellness visit patient questionnaire was reviewed.  A written screening schedule for the patient for the next 5-10 years was given. Appropriate discussion of followup regarding next visit was discussed.   The 10-year ASCVD risk score (Arnett DK, et al., 2019) is: 14.6%   Values used to calculate the score:     Age: 36 years     Sex: Male     Is Non-Hispanic African American: No     Diabetic: No     Tobacco smoker: No     Systolic Blood Pressure: 497 mmHg     Is BP treated: No     HDL Cholesterol: 54 mg/dL     Total Cholesterol: 170 mg/dL      Review of Systems     Objective:   Physical Exam General-in no acute distress Eyes-no discharge Lungs-respiratory rate normal, CTA CV-no murmurs,RRR Extremities skin warm  dry no edema Neuro grossly normal Behavior normal, alert Prostate exam soft Lab reviewed        Assessment & Plan:  1. Well adult exam Adult wellness-complete.wellness physical was conducted today. Importance of diet and exercise were discussed in detail.  Importance of stress reduction and healthy living were discussed.  In addition to this a discussion regarding safety was also covered.  We also reviewed over immunizations and gave recommendations regarding current immunization needed for age.   In addition to this additional areas were also touched on including: Preventative health exams needed:  Colonoscopy 2028  Patient was advised yearly wellness exam  - CT CARDIAC SCORING (SELF PAY ONLY)  2. Subclinical hypothyroidism TSH slightly elevated but the other parameters are good  3. Other hyperlipidemia On 80 of Lipitor tolerating well but would like to see LDL lower.  For risk stratification will do coronary calcium.  If this shows severe issues or moderate issues consideration for adding Zetia versus injectable - CT CARDIAC SCORING (SELF PAY ONLY)  4. Elevated PSA PSA elevated referral to urology had this issue back in 2019 he states he had exercise recently before this blood work could have affected the result as well as recent ejaculation - Ambulatory referral to Urology  5. Encounter for subsequent annual wellness visit (AWV) in Medicare patient Supportive measures were discussed preventative measures discussed.  .  Patient will do a follow-up visit in approximately 6 months He does have frequent headaches and needs a refill  of his compound that he gets through [facility] pharmacy

## 2022-05-11 NOTE — Telephone Encounter (Signed)
Please write this out on a prescription place it on my door I will sign it over the weekend

## 2022-05-12 ENCOUNTER — Other Ambulatory Visit: Payer: Self-pay | Admitting: Family Medicine

## 2022-05-12 ENCOUNTER — Encounter: Payer: Self-pay | Admitting: Family Medicine

## 2022-05-12 NOTE — Telephone Encounter (Signed)
I will talk with Dylan Chapman's pharmacy early in the week then this will be faxed

## 2022-05-15 ENCOUNTER — Other Ambulatory Visit: Payer: Self-pay | Admitting: Family Medicine

## 2022-05-21 NOTE — Telephone Encounter (Signed)
May have 6 months on refills (I wrote out a prescription for this and it was faxed somewhere in the past 2 weeks to [facility])

## 2022-06-21 ENCOUNTER — Encounter: Payer: Self-pay | Admitting: Radiology

## 2022-06-27 ENCOUNTER — Ambulatory Visit (HOSPITAL_COMMUNITY)
Admission: RE | Admit: 2022-06-27 | Discharge: 2022-06-27 | Disposition: A | Discharge status: Home / Self Care | Payer: Medicare HMO | Source: Ambulatory Visit | Attending: Family Medicine | Admitting: Family Medicine

## 2022-06-27 DIAGNOSIS — E7849 Other hyperlipidemia: Secondary | ICD-10-CM | POA: Insufficient documentation

## 2022-06-27 DIAGNOSIS — Z0000 Encounter for general adult medical examination without abnormal findings: Secondary | ICD-10-CM | POA: Insufficient documentation

## 2022-06-28 ENCOUNTER — Encounter: Payer: Self-pay | Admitting: Family Medicine

## 2022-06-28 ENCOUNTER — Telehealth: Payer: Self-pay

## 2022-06-28 NOTE — Telephone Encounter (Signed)
Call received from [facility] to make sure the doctor is able to see the addendum over read from CT calcium core regarding pulmonary findings.

## 2022-06-28 NOTE — Telephone Encounter (Signed)
I did see this-I will talk with patient this week thank you

## 2022-06-29 ENCOUNTER — Other Ambulatory Visit: Payer: Self-pay | Admitting: Family Medicine

## 2022-06-30 ENCOUNTER — Other Ambulatory Visit: Payer: Self-pay | Admitting: Family Medicine

## 2022-06-30 ENCOUNTER — Encounter: Payer: Self-pay | Admitting: Family Medicine

## 2022-06-30 MED ORDER — ROSUVASTATIN CALCIUM 40 MG PO TABS: 40.0000 mg | ORAL_TABLET | Freq: Every day | ORAL | 1 refills | Status: DC

## 2022-06-30 NOTE — Telephone Encounter (Signed)
Lipid and liver in 10 weeks Patient aware

## 2022-07-01 NOTE — Telephone Encounter (Signed)
Nurses-please put in lipid liver profile patient will do this in approximately 8 to 10 weeks.  Patient is aware of these issues but needs to have these orders completed by nursing thank you  I changed his medicine from Lipitor to Crestor.  His test shows minimal coronary artery disease.  Also shows pulmonary nodule because the cardiac calcium score test is a minimal look at the lungs he needs of full dedicated noncontrast chest CT scan to look at pulmonary nodule.  I spoke with him about the results of this test.  Patient is aware of all of this.  Please also make sure that noncontrast CT of the chest is completed in the near future due to pulmonary nodule-in other words order the CT of the chest please If any questions please let me know

## 2022-07-02 ENCOUNTER — Other Ambulatory Visit: Payer: Self-pay

## 2022-07-02 DIAGNOSIS — E785 Hyperlipidemia, unspecified: Secondary | ICD-10-CM

## 2022-07-02 DIAGNOSIS — R911 Solitary pulmonary nodule: Secondary | ICD-10-CM

## 2022-07-02 DIAGNOSIS — Z79899 Other long term (current) drug therapy: Secondary | ICD-10-CM

## 2022-07-02 NOTE — Telephone Encounter (Signed)
Labs ordered in EPIC and CT of the chest ordered as requested.

## 2022-07-08 ENCOUNTER — Encounter: Payer: Self-pay | Admitting: Family Medicine

## 2022-07-09 ENCOUNTER — Encounter: Payer: Self-pay | Admitting: Family Medicine

## 2022-07-14 ENCOUNTER — Encounter: Payer: Self-pay | Admitting: Family Medicine

## 2022-07-18 ENCOUNTER — Other Ambulatory Visit: Payer: Self-pay | Admitting: Family Medicine

## 2022-07-18 NOTE — Telephone Encounter (Signed)
May have 6 months on this medicine

## 2022-07-24 ENCOUNTER — Ambulatory Visit (HOSPITAL_COMMUNITY)
Admission: RE | Admit: 2022-07-24 | Discharge: 2022-07-24 | Disposition: A | Discharge status: Home / Self Care | Payer: Medicare HMO | Source: Ambulatory Visit | Attending: Family Medicine | Admitting: Family Medicine

## 2022-07-24 DIAGNOSIS — J479 Bronchiectasis, uncomplicated: Secondary | ICD-10-CM | POA: Diagnosis not present

## 2022-07-24 DIAGNOSIS — R911 Solitary pulmonary nodule: Secondary | ICD-10-CM | POA: Diagnosis not present

## 2022-07-27 ENCOUNTER — Encounter: Payer: Self-pay | Admitting: Family Medicine

## 2022-08-01 ENCOUNTER — Other Ambulatory Visit: Payer: Self-pay | Admitting: Family Medicine

## 2022-08-01 ENCOUNTER — Encounter: Payer: Self-pay | Admitting: Family Medicine

## 2022-08-01 MED ORDER — SILDENAFIL CITRATE 100 MG PO TABS: 50.0000 mg | ORAL_TABLET | Freq: Every day | ORAL | 1 refills | Status: DC | PRN

## 2022-08-08 ENCOUNTER — Ambulatory Visit (HOSPITAL_COMMUNITY): Payer: Medicare HMO

## 2022-08-20 DIAGNOSIS — R972 Elevated prostate specific antigen [PSA]: Secondary | ICD-10-CM | POA: Diagnosis not present

## 2022-08-20 DIAGNOSIS — N5201 Erectile dysfunction due to arterial insufficiency: Secondary | ICD-10-CM | POA: Diagnosis not present

## 2022-09-03 DIAGNOSIS — R972 Elevated prostate specific antigen [PSA]: Secondary | ICD-10-CM | POA: Diagnosis not present

## 2022-10-15 DIAGNOSIS — L821 Other seborrheic keratosis: Secondary | ICD-10-CM | POA: Diagnosis not present

## 2022-10-15 DIAGNOSIS — L814 Other melanin hyperpigmentation: Secondary | ICD-10-CM | POA: Diagnosis not present

## 2022-10-15 DIAGNOSIS — L80 Vitiligo: Secondary | ICD-10-CM | POA: Diagnosis not present

## 2022-10-15 DIAGNOSIS — D225 Melanocytic nevi of trunk: Secondary | ICD-10-CM | POA: Diagnosis not present

## 2022-10-15 DIAGNOSIS — L578 Other skin changes due to chronic exposure to nonionizing radiation: Secondary | ICD-10-CM | POA: Diagnosis not present

## 2022-10-15 DIAGNOSIS — L570 Actinic keratosis: Secondary | ICD-10-CM | POA: Diagnosis not present

## 2022-11-05 DIAGNOSIS — Z8249 Family history of ischemic heart disease and other diseases of the circulatory system: Secondary | ICD-10-CM | POA: Diagnosis not present

## 2022-11-05 DIAGNOSIS — K219 Gastro-esophageal reflux disease without esophagitis: Secondary | ICD-10-CM | POA: Diagnosis not present

## 2022-11-05 DIAGNOSIS — N182 Chronic kidney disease, stage 2 (mild): Secondary | ICD-10-CM | POA: Diagnosis not present

## 2022-11-05 DIAGNOSIS — H9193 Unspecified hearing loss, bilateral: Secondary | ICD-10-CM | POA: Diagnosis not present

## 2022-11-05 DIAGNOSIS — I129 Hypertensive chronic kidney disease with stage 1 through stage 4 chronic kidney disease, or unspecified chronic kidney disease: Secondary | ICD-10-CM | POA: Diagnosis not present

## 2022-11-05 DIAGNOSIS — G43909 Migraine, unspecified, not intractable, without status migrainosus: Secondary | ICD-10-CM | POA: Diagnosis not present

## 2022-11-05 DIAGNOSIS — E785 Hyperlipidemia, unspecified: Secondary | ICD-10-CM | POA: Diagnosis not present

## 2022-11-05 DIAGNOSIS — Z880 Allergy status to penicillin: Secondary | ICD-10-CM | POA: Diagnosis not present

## 2022-11-05 DIAGNOSIS — Z87891 Personal history of nicotine dependence: Secondary | ICD-10-CM | POA: Diagnosis not present

## 2023-01-10 ENCOUNTER — Encounter: Payer: Self-pay | Admitting: Family Medicine

## 2023-01-11 NOTE — Telephone Encounter (Signed)
Nurses Please work with Lorin Picket to try to get him in somewhere in the next couple weeks with me (Unfortunately office schedule has been overbooked recently but please try to get him in thank you)  Please also notify Jacarie that I was able to read his message sorry he is having trouble with the left hip area we will try to see him as soon as possible thanks-Dr. Lorin Picket

## 2023-01-14 ENCOUNTER — Other Ambulatory Visit: Payer: Self-pay | Admitting: Family Medicine

## 2023-01-25 ENCOUNTER — Ambulatory Visit (INDEPENDENT_AMBULATORY_CARE_PROVIDER_SITE_OTHER): Payer: Medicare HMO | Admitting: Family Medicine

## 2023-01-25 VITALS — BP 118/80 | HR 75 | Ht 67.5 in | Wt 161.2 lb

## 2023-01-25 DIAGNOSIS — M7632 Iliotibial band syndrome, left leg: Secondary | ICD-10-CM

## 2023-01-25 DIAGNOSIS — Z23 Encounter for immunization: Secondary | ICD-10-CM | POA: Diagnosis not present

## 2023-01-25 DIAGNOSIS — L80 Vitiligo: Secondary | ICD-10-CM

## 2023-01-25 DIAGNOSIS — N529 Male erectile dysfunction, unspecified: Secondary | ICD-10-CM

## 2023-01-25 MED ORDER — DICLOFENAC SODIUM 75 MG PO TBEC: 75.0000 mg | DELAYED_RELEASE_TABLET | Freq: Two times a day (BID) | ORAL | 1 refills | Status: DC

## 2023-01-25 NOTE — Progress Notes (Unsigned)
Subjective:    Patient ID: Dylan Chapman, male    DOB: [birth year], 72 y.o.   MRN: [id number]  Discussed the use of AI scribe software for clinical note transcription with the patient, who gave verbal consent to proceed.  History of Present Illness   The patient, an active individual with a history of hiking, presented with persistent left hip discomfort that has been noticeable for several months, particularly after a recent hiking trip involving significant vertical gain. The discomfort is described as a sharp pain, particularly noticeable when sleeping on the side, getting out of bed or the car, and when the left leg is crossed over for a few minutes. The pain is localized to the side of the hip and does not radiate down the leg. There are no associated symptoms of numbness or tingling.  He has attempted self-management with over-the-counter NSAIDs, which provided temporary relief. However, the discomfort has persisted and even increased after a recent hiking trip involving significant mileage and vertical gain. He has not tried any other interventions such as icing or physical therapy.  In addition to the hip discomfort, he also reported a transient back spasm that resolved on its own after about two and a half weeks. This back issue did not interfere with his hiking activities and has not recurred.  He also mentioned a history of erectile dysfunction, for which Sildenafil was previously prescribed. However, this medication was associated with prolonged headaches. He has been prescribed Tadalafil by a urologist but has not yet tried this medication.  Lastly, he has noticed white patches on the penis, which he believes to be vitiligo. This has not caused any symptoms or concerns for him.         Review of Systems     Objective:    Physical Exam   CHEST: Lungs clear to auscultation bilaterally. Heart sounds normal with regular rate and rhythm. GENITOURINARY: Vitiligo on  penis. MUSCULOSKELETAL: Left hip pain localized to lateral aspect, exacerbated by external rotation. No anterior hip pain.      {Labs (Optional):23779}     Assessment & Plan:  Assessment and Plan    Left Hip Pain Persistent pain for several months, exacerbated by certain movements and positions. Likely related to iliotibial band inflammation. No radiating pain or neurological symptoms. -Start Diclofenac twice daily for 10-14 days. -Print out exercises for hip pain for patient to perform at home. -Consider physical therapy if no improvement in 2 weeks. -Consider X-ray if no improvement in 2 weeks.  Erectile Dysfunction Previous use of Sildenafil resulted in prolonged headaches. Tadalafil prescribed by urologist but not yet used. -Advise patient to try Tadalafil 5mg  daily, with awareness of potential for headaches.  Nocturia Waking 2-3 times per night to urinate. -Consider use of daily Tadalafil for improved bladder emptying and reduced nocturia.  Herpes Simplex 2 Patient's partner has a history of HSV-2, but no current symptoms. Patient concerned about potential transmission. -Advise patient on potential risks and preventive measures, including partner's use of daily Valtrex to suppress outbreaks.  Vitiligo White patches noted on penis, likely related to vitiligo. -No treatment necessary, reassurance provided.  General Health Maintenance -Administer influenza vaccine today. -Schedule wellness check for January 2025.      1. Needs flu shot Today - Flu Vaccine Trivalent High Dose (Fluad)  2. Iliotibial band syndrome of left side Stretches show Voltaren twice daily Should gradually get better over the course the next couple weeks if not physical therapy and an x-ray  If ongoing troubles orthopedic referral  3. Vitiligo Penile area  4. Erectile dysfunction, unspecified erectile dysfunction type Consider Cialis 5 mg daily and please let us know if needing a prescription  sent in

## 2023-01-26 NOTE — Addendum Note (Signed)
Addended by: Lilyan Punt A on: 01/26/2023 10:19 AM   Modules accepted: Orders

## 2023-01-28 ENCOUNTER — Other Ambulatory Visit: Payer: Self-pay

## 2023-01-28 MED ORDER — TADALAFIL 5 MG PO TABS: 5.0000 mg | ORAL_TABLET | Freq: Every day | ORAL | Status: DC

## 2023-02-08 ENCOUNTER — Encounter: Payer: Self-pay | Admitting: Family Medicine

## 2023-02-11 NOTE — Telephone Encounter (Signed)
Nurses Please have referral coordinator assist with getting an appointment at [facility] for Mr. Dylan Chapman he has been seen by orthopedist there before. Reason for referral hip pain left side

## 2023-02-13 ENCOUNTER — Other Ambulatory Visit: Payer: Self-pay | Admitting: Family Medicine

## 2023-02-13 ENCOUNTER — Other Ambulatory Visit: Payer: Self-pay

## 2023-02-13 ENCOUNTER — Encounter: Payer: Self-pay | Admitting: Family Medicine

## 2023-02-13 DIAGNOSIS — M25552 Pain in left hip: Secondary | ICD-10-CM

## 2023-02-13 MED ORDER — TADALAFIL 5 MG PO TABS: ORAL_TABLET | ORAL | 11 refills | Status: DC

## 2023-02-18 DIAGNOSIS — R972 Elevated prostate specific antigen [PSA]: Secondary | ICD-10-CM | POA: Diagnosis not present

## 2023-02-28 DIAGNOSIS — M7062 Trochanteric bursitis, left hip: Secondary | ICD-10-CM | POA: Diagnosis not present

## 2023-02-28 DIAGNOSIS — M25552 Pain in left hip: Secondary | ICD-10-CM | POA: Diagnosis not present

## 2023-03-12 DIAGNOSIS — M7062 Trochanteric bursitis, left hip: Secondary | ICD-10-CM | POA: Diagnosis not present

## 2023-04-02 ENCOUNTER — Encounter: Payer: Self-pay | Admitting: Family Medicine

## 2023-04-02 NOTE — Telephone Encounter (Signed)
Nurses Please connect with Dylan Chapman I would like to see him in a same-day slot Wednesday Thursday or Friday for this issue Although it could be something as simple as acid reflux it could also be something else going on we would not want to presume or overbook any other issues  Also we may need to have possible testing and discussion of treatment plan so therefore office visit would be best thank you

## 2023-04-04 ENCOUNTER — Ambulatory Visit: Payer: Medicare HMO | Admitting: Family Medicine

## 2023-04-04 NOTE — Telephone Encounter (Signed)
Nurses You may give Dylan Chapman an appointment Monday the 16th at 11:20 AM

## 2023-04-05 NOTE — Telephone Encounter (Signed)
Patient scheduled office visit with Dr Lorin Picket 04/08/23

## 2023-04-08 ENCOUNTER — Ambulatory Visit: Payer: Medicare HMO | Admitting: Family Medicine

## 2023-04-08 VITALS — BP 122/78 | HR 76 | Temp 97.4°F | Ht 67.5 in | Wt 165.0 lb

## 2023-04-08 DIAGNOSIS — K219 Gastro-esophageal reflux disease without esophagitis: Secondary | ICD-10-CM | POA: Diagnosis not present

## 2023-04-08 MED ORDER — PANTOPRAZOLE SODIUM 40 MG PO TBEC: 40.0000 mg | DELAYED_RELEASE_TABLET | Freq: Every day | ORAL | 1 refills | Status: DC

## 2023-04-08 NOTE — Patient Instructions (Signed)

## 2023-04-08 NOTE — Progress Notes (Signed)
   Subjective:    Patient ID: Dylan Chapman, male    DOB: [birth year], 72 y.o.   MRN: [id number]  HPI Patient with frequent heartburn issues multiple times per week describes it in his throat region and upper esophagus denies any issues with dysphagia or hoarseness vomiting or epigastric pain states certain medicines in the past were used including pantoprazole and famotidine Takes Tums multiple times a week Denies any previous troubles   Review of Systems     Objective:   Physical Exam  General-in no acute distress Eyes-no discharge Lungs-respiratory rate normal, CTA CV-no murmurs,RRR Extremities skin warm dry no edema Neuro grossly normal Behavior normal, alert Abdomen soft epigastric normal      Assessment & Plan:  GERD May use Tums as needed PPI for 6 to 8 weeks Pantoprazole daily Give [place] feedback within 14 to 21 days After 8 weeks consider tapering down pantoprazole over to famotidine Notify us if any setbacks or problems

## 2023-04-29 ENCOUNTER — Encounter: Payer: Self-pay | Admitting: Family Medicine

## 2023-04-30 NOTE — Telephone Encounter (Signed)
 Nurses Please send in famotidine 40 mg 1 daily, #90 with 2 refills On the prescription please put cancel pantoprazole  Appreciated thanks-Dr. Lorin Picket

## 2023-05-01 ENCOUNTER — Other Ambulatory Visit: Payer: Self-pay | Admitting: Family Medicine

## 2023-05-01 ENCOUNTER — Other Ambulatory Visit: Payer: Self-pay

## 2023-05-01 MED ORDER — FAMOTIDINE 40 MG PO TABS: 40.0000 mg | ORAL_TABLET | Freq: Every day | ORAL | 2 refills | Status: DC

## 2023-05-13 ENCOUNTER — Encounter: Payer: Self-pay | Admitting: Family Medicine

## 2023-05-13 ENCOUNTER — Encounter: Payer: Medicare HMO | Admitting: Family Medicine

## 2023-05-14 DIAGNOSIS — L821 Other seborrheic keratosis: Secondary | ICD-10-CM | POA: Diagnosis not present

## 2023-05-14 DIAGNOSIS — L570 Actinic keratosis: Secondary | ICD-10-CM | POA: Diagnosis not present

## 2023-05-15 ENCOUNTER — Ambulatory Visit (INDEPENDENT_AMBULATORY_CARE_PROVIDER_SITE_OTHER): Payer: Medicare HMO | Admitting: Family Medicine

## 2023-05-15 ENCOUNTER — Encounter: Payer: Self-pay | Admitting: Family Medicine

## 2023-05-15 VITALS — BP 112/82 | HR 73 | Temp 98.0°F | Ht 67.5 in | Wt 165.0 lb

## 2023-05-15 DIAGNOSIS — I77811 Abdominal aortic ectasia: Secondary | ICD-10-CM | POA: Diagnosis not present

## 2023-05-15 DIAGNOSIS — R972 Elevated prostate specific antigen [PSA]: Secondary | ICD-10-CM

## 2023-05-15 DIAGNOSIS — Z0000 Encounter for general adult medical examination without abnormal findings: Secondary | ICD-10-CM

## 2023-05-15 DIAGNOSIS — E038 Other specified hypothyroidism: Secondary | ICD-10-CM | POA: Diagnosis not present

## 2023-05-15 DIAGNOSIS — Z79899 Other long term (current) drug therapy: Secondary | ICD-10-CM | POA: Diagnosis not present

## 2023-05-15 DIAGNOSIS — E7849 Other hyperlipidemia: Secondary | ICD-10-CM | POA: Diagnosis not present

## 2023-05-15 DIAGNOSIS — G43019 Migraine without aura, intractable, without status migrainosus: Secondary | ICD-10-CM | POA: Diagnosis not present

## 2023-05-15 DIAGNOSIS — Z0001 Encounter for general adult medical examination with abnormal findings: Secondary | ICD-10-CM

## 2023-05-15 NOTE — Progress Notes (Signed)
   Subjective:    Patient ID: Dylan Chapman, male    DOB: 1950-12-20, 73 y.o.   MRN: [id number]  HPI The patient comes in today for a wellness visit.    A review of their health history was completed.  A review of medications was also completed.  Any needed refills; we will update refills  Eating habits: Tries to eat relatively healthy but could do better on vegetables and fruits, he lives by himself  Falls/  MVA accidents in past few months: Denies any injuries or falls recently  Regular exercise: Keeps physically active  Specialist pt sees on regular basis: Does see urology  Preventative health issues were discussed.  Cognitive-passes normal Additional concerns: Relates he does have frequent headaches.  He is contemplating one of the newer medicines.  He has tried calcium channel blockers in the past as well as topiramate neither 1 did help for now he states he will stick with his current regimen    Review of Systems     Objective:   Physical Exam  General-in no acute distress Eyes-no discharge Lungs-respiratory rate normal, CTA CV-no murmurs,RRR Extremities skin warm dry no edema Neuro grossly normal Behavior normal, alert Prostate exam within normal limits soft      Assessment & Plan:  BPH with erectile dysfunction continue Cialis 5 mg daily Hyperlipidemia continue Crestor daily Chronic migraines greater than 15/month patient on the treatment that he takes in the morning time currently he has tried Topamax in the past and did not improve with that was seen by neurology and they have tried a few different things He will let us know if he is interested in trying one of the newer medicines. Adult wellness-complete.wellness physical was conducted today. Importance of diet and exercise were discussed in detail.  Importance of stress reduction and healthy living were discussed.  In addition to this a discussion regarding safety was also covered.  We also reviewed  over immunizations and gave recommendations regarding current immunization needed for age.   In addition to this additional areas were also touched on including: Preventative health exams needed:  Colonoscopy April 2028  Patient was advised yearly wellness exam  COVID recommended  >diagmed 1. Encounter for subsequent annual wellness visit (AWV) in Medicare patient (Primary) See per above  2. Subclinical hypothyroidism We will look at thyroid function.  If this is progressed to out right hyperthyroidism then we will initiate medication otherwise follow on a yearly basis  3. Other hyperlipidemia Rosuvastatin continue healthy diet check labs  4. Elevated PSA Followed by urology  5. Intractable migraine without aura and without status migrainosus Patient on current headache cocktail he is taking that is doing well currently does not want to explore additional medicines at this time he will let us know if this gets worse  6. Ectatic abdominal aorta (HCC) Chart review this evening showed patient had slight dilation of the aorta in 2018, this result was reviewed, per protocol this is recommended to be looked at again so we will help set him up for abdominal aortic ultrasound  Follow-up 6 months

## 2023-05-16 NOTE — Addendum Note (Signed)
Addended by: Sande Brothers on: 05/16/2023 08:08 AM   Modules accepted: Orders

## 2023-05-20 DIAGNOSIS — E038 Other specified hypothyroidism: Secondary | ICD-10-CM | POA: Diagnosis not present

## 2023-05-20 DIAGNOSIS — Z79899 Other long term (current) drug therapy: Secondary | ICD-10-CM | POA: Diagnosis not present

## 2023-05-20 DIAGNOSIS — E7849 Other hyperlipidemia: Secondary | ICD-10-CM | POA: Diagnosis not present

## 2023-05-21 LAB — LIPID PANEL
Chol/HDL Ratio: 2.9 {ratio} (ref 0.0–5.0)
Cholesterol, Total: 172 mg/dL (ref 100–199)
HDL: 59 mg/dL (ref >39)
LDL Chol Calc (NIH): 100 mg/dL — ABNORMAL HIGH (ref 0–99)
Triglycerides: 67 mg/dL (ref 0–149)
VLDL Cholesterol Cal: 13 mg/dL (ref 5–40)

## 2023-05-21 LAB — BASIC METABOLIC PANEL
BUN/Creatinine Ratio: 19 (ref 10–24)
BUN: 25 mg/dL (ref 8–27)
CO2: 24 mmol/L (ref 20–29)
Calcium: 9.3 mg/dL (ref 8.6–10.2)
Chloride: 104 mmol/L (ref 96–106)
Creatinine, Ser: 1.3 mg/dL — ABNORMAL HIGH (ref 0.76–1.27)
Glucose: 94 mg/dL (ref 70–99)
Potassium: 4.5 mmol/L (ref 3.5–5.2)
Sodium: 140 mmol/L (ref 134–144)
eGFR: 59 mL/min/{1.73_m2} — ABNORMAL LOW (ref >59)

## 2023-05-21 LAB — TSH: TSH: 10 u[IU]/mL — ABNORMAL HIGH (ref 0.450–4.500)

## 2023-05-21 LAB — HEPATIC FUNCTION PANEL
ALT: 14 [IU]/L (ref 0–44)
AST: 19 [IU]/L (ref 0–40)
Albumin: 4.2 g/dL (ref 3.8–4.8)
Alkaline Phosphatase: 69 [IU]/L (ref 44–121)
Bilirubin Total: 0.3 mg/dL (ref 0.0–1.2)
Bilirubin, Direct: 0.13 mg/dL (ref 0.00–0.40)
Total Protein: 6.4 g/dL (ref 6.0–8.5)

## 2023-05-21 LAB — T4, FREE: Free T4: 1.06 ng/dL (ref 0.82–1.77)

## 2023-05-23 ENCOUNTER — Encounter: Payer: Self-pay | Admitting: Family Medicine

## 2023-05-25 ENCOUNTER — Other Ambulatory Visit: Payer: Self-pay | Admitting: Family Medicine

## 2023-05-25 ENCOUNTER — Telehealth: Payer: Self-pay | Admitting: Family Medicine

## 2023-05-25 MED ORDER — LEVOTHYROXINE SODIUM 25 MCG PO TABS: ORAL_TABLET | ORAL | 1 refills | Status: DC

## 2023-05-25 MED ORDER — EZETIMIBE 10 MG PO TABS: 10.0000 mg | ORAL_TABLET | Freq: Every day | ORAL | 1 refills | Status: DC

## 2023-05-25 NOTE — Telephone Encounter (Signed)
Nurses Please order the following lipid, liver, metabolic 7, urine ACR, TSH and T4 Diagnosis hyperlipidemia, high risk medication, hypothyroidism, renal insufficiency  Patient is aware to do these labs in approximately 10 weeks when well-hydrated  For documentation purposes-I had a good conversation with the patient regarding his labs and follow-up questions he had regarding MyChart message. We are initiating levothyroxine and Zetia-I sent these medications in today.  Patient is aware. Side note-goal is to get LDL below 70 if possible and to get TSH into a normal range and follow T4 closely also we will do a follow-up on kidney function when patient well-hydrated

## 2023-05-27 ENCOUNTER — Other Ambulatory Visit: Payer: Self-pay

## 2023-05-27 DIAGNOSIS — Z79899 Other long term (current) drug therapy: Secondary | ICD-10-CM

## 2023-05-27 DIAGNOSIS — N289 Disorder of kidney and ureter, unspecified: Secondary | ICD-10-CM

## 2023-05-27 DIAGNOSIS — E785 Hyperlipidemia, unspecified: Secondary | ICD-10-CM

## 2023-05-27 DIAGNOSIS — E038 Other specified hypothyroidism: Secondary | ICD-10-CM

## 2023-05-27 DIAGNOSIS — E039 Hypothyroidism, unspecified: Secondary | ICD-10-CM

## 2023-06-25 DIAGNOSIS — Z0100 Encounter for examination of eyes and vision without abnormal findings: Secondary | ICD-10-CM | POA: Diagnosis not present

## 2023-07-30 ENCOUNTER — Encounter: Payer: Self-pay | Admitting: Family Medicine

## 2023-08-02 DIAGNOSIS — N401 Enlarged prostate with lower urinary tract symptoms: Secondary | ICD-10-CM | POA: Diagnosis not present

## 2023-08-02 DIAGNOSIS — R972 Elevated prostate specific antigen [PSA]: Secondary | ICD-10-CM | POA: Diagnosis not present

## 2023-08-02 DIAGNOSIS — Z79899 Other long term (current) drug therapy: Secondary | ICD-10-CM | POA: Diagnosis not present

## 2023-08-02 DIAGNOSIS — E039 Hypothyroidism, unspecified: Secondary | ICD-10-CM | POA: Diagnosis not present

## 2023-08-02 DIAGNOSIS — E785 Hyperlipidemia, unspecified: Secondary | ICD-10-CM | POA: Diagnosis not present

## 2023-08-02 DIAGNOSIS — E038 Other specified hypothyroidism: Secondary | ICD-10-CM | POA: Diagnosis not present

## 2023-08-02 DIAGNOSIS — N289 Disorder of kidney and ureter, unspecified: Secondary | ICD-10-CM | POA: Diagnosis not present

## 2023-08-03 LAB — LIPID PANEL
Chol/HDL Ratio: 2.4 ratio (ref 0.0–5.0)
Cholesterol, Total: 142 mg/dL (ref 100–199)
HDL: 60 mg/dL (ref >39)
LDL Chol Calc (NIH): 70 mg/dL (ref 0–99)
Triglycerides: 55 mg/dL (ref 0–149)
VLDL Cholesterol Cal: 12 mg/dL (ref 5–40)

## 2023-08-03 LAB — HEPATIC FUNCTION PANEL
ALT: 25 IU/L (ref 0–44)
AST: 30 IU/L (ref 0–40)
Albumin: 4.5 g/dL (ref 3.8–4.8)
Alkaline Phosphatase: 81 IU/L (ref 44–121)
Bilirubin Total: 0.7 mg/dL (ref 0.0–1.2)
Bilirubin, Direct: 0.23 mg/dL (ref 0.00–0.40)
Total Protein: 7 g/dL (ref 6.0–8.5)

## 2023-08-03 LAB — TSH+FREE T4
Free T4: 1.18 ng/dL (ref 0.82–1.77)
TSH: 3.98 u[IU]/mL (ref 0.450–4.500)

## 2023-08-03 LAB — BASIC METABOLIC PANEL WITH GFR
BUN/Creatinine Ratio: 14 (ref 10–24)
BUN: 19 mg/dL (ref 8–27)
CO2: 23 mmol/L (ref 20–29)
Calcium: 9.6 mg/dL (ref 8.6–10.2)
Chloride: 101 mmol/L (ref 96–106)
Creatinine, Ser: 1.35 mg/dL — ABNORMAL HIGH (ref 0.76–1.27)
Glucose: 87 mg/dL (ref 70–99)
Potassium: 4.8 mmol/L (ref 3.5–5.2)
Sodium: 140 mmol/L (ref 134–144)
eGFR: 56 mL/min/{1.73_m2} — ABNORMAL LOW (ref >59)

## 2023-08-03 LAB — MICROALBUMIN / CREATININE URINE RATIO
Creatinine, Urine: 16.8 mg/dL
Microalb/Creat Ratio: <18 mg/g{creat} (ref 0–29)
Microalbumin, Urine: <3 ug/mL

## 2023-08-05 ENCOUNTER — Encounter: Payer: Self-pay | Admitting: Family Medicine

## 2023-08-05 ENCOUNTER — Other Ambulatory Visit: Payer: Self-pay | Admitting: Family Medicine

## 2023-08-06 ENCOUNTER — Other Ambulatory Visit: Payer: Self-pay

## 2023-08-06 DIAGNOSIS — R7989 Other specified abnormal findings of blood chemistry: Secondary | ICD-10-CM

## 2023-08-06 NOTE — Telephone Encounter (Signed)
 Nurses Please go ahead with a consultation with nephrology [facility] [place] office Diagnosis elevated creatinine Send Carmen Tolliver notification that this referral has been initiated-typically most of these consultations are seen somewhere in the ballpark of 6 to 8 weeks sometimes sooner if they have openings [facility] will keep us  updated on his headaches and medications.  Thanks-Dr. Geralyn Knee

## 2023-08-23 DIAGNOSIS — N5201 Erectile dysfunction due to arterial insufficiency: Secondary | ICD-10-CM | POA: Diagnosis not present

## 2023-08-23 DIAGNOSIS — N401 Enlarged prostate with lower urinary tract symptoms: Secondary | ICD-10-CM | POA: Diagnosis not present

## 2023-08-23 DIAGNOSIS — R351 Nocturia: Secondary | ICD-10-CM | POA: Diagnosis not present

## 2023-08-23 DIAGNOSIS — R3912 Poor urinary stream: Secondary | ICD-10-CM | POA: Diagnosis not present

## 2023-08-23 DIAGNOSIS — R972 Elevated prostate specific antigen [PSA]: Secondary | ICD-10-CM | POA: Diagnosis not present

## 2023-09-04 ENCOUNTER — Encounter: Payer: Self-pay | Admitting: Family Medicine

## 2023-09-10 ENCOUNTER — Encounter: Payer: Self-pay | Admitting: Family Medicine

## 2023-09-25 DIAGNOSIS — H25813 Combined forms of age-related cataract, bilateral: Secondary | ICD-10-CM | POA: Diagnosis not present

## 2023-09-25 DIAGNOSIS — H5203 Hypermetropia, bilateral: Secondary | ICD-10-CM | POA: Diagnosis not present

## 2023-10-15 DIAGNOSIS — D225 Melanocytic nevi of trunk: Secondary | ICD-10-CM | POA: Diagnosis not present

## 2023-10-15 DIAGNOSIS — L814 Other melanin hyperpigmentation: Secondary | ICD-10-CM | POA: Diagnosis not present

## 2023-10-15 DIAGNOSIS — L570 Actinic keratosis: Secondary | ICD-10-CM | POA: Diagnosis not present

## 2023-10-15 DIAGNOSIS — L218 Other seborrheic dermatitis: Secondary | ICD-10-CM | POA: Diagnosis not present

## 2023-10-15 DIAGNOSIS — L918 Other hypertrophic disorders of the skin: Secondary | ICD-10-CM | POA: Diagnosis not present

## 2023-10-15 DIAGNOSIS — C44612 Basal cell carcinoma of skin of right upper limb, including shoulder: Secondary | ICD-10-CM | POA: Diagnosis not present

## 2023-10-15 DIAGNOSIS — D492 Neoplasm of unspecified behavior of bone, soft tissue, and skin: Secondary | ICD-10-CM | POA: Diagnosis not present

## 2023-10-15 DIAGNOSIS — L821 Other seborrheic keratosis: Secondary | ICD-10-CM | POA: Diagnosis not present

## 2023-10-15 DIAGNOSIS — L538 Other specified erythematous conditions: Secondary | ICD-10-CM | POA: Diagnosis not present

## 2023-10-18 ENCOUNTER — Other Ambulatory Visit (HOSPITAL_COMMUNITY): Payer: Self-pay | Admitting: Nephrology

## 2023-10-18 DIAGNOSIS — N189 Chronic kidney disease, unspecified: Secondary | ICD-10-CM

## 2023-10-18 DIAGNOSIS — G4733 Obstructive sleep apnea (adult) (pediatric): Secondary | ICD-10-CM | POA: Diagnosis not present

## 2023-10-18 DIAGNOSIS — R911 Solitary pulmonary nodule: Secondary | ICD-10-CM | POA: Diagnosis not present

## 2023-10-18 DIAGNOSIS — N1831 Chronic kidney disease, stage 3a: Secondary | ICD-10-CM | POA: Diagnosis not present

## 2023-10-18 DIAGNOSIS — R972 Elevated prostate specific antigen [PSA]: Secondary | ICD-10-CM | POA: Diagnosis not present

## 2023-10-28 ENCOUNTER — Ambulatory Visit (HOSPITAL_COMMUNITY)
Admission: RE | Admit: 2023-10-28 | Discharge: 2023-10-28 | Disposition: A | Discharge status: Home / Self Care | Source: Ambulatory Visit | Attending: Nephrology | Admitting: Nephrology

## 2023-10-28 ENCOUNTER — Encounter: Payer: Self-pay | Admitting: Family Medicine

## 2023-10-28 DIAGNOSIS — N189 Chronic kidney disease, unspecified: Secondary | ICD-10-CM | POA: Insufficient documentation

## 2023-11-08 DIAGNOSIS — C44612 Basal cell carcinoma of skin of right upper limb, including shoulder: Secondary | ICD-10-CM | POA: Diagnosis not present

## 2023-11-11 ENCOUNTER — Encounter: Payer: Self-pay | Admitting: Family Medicine

## 2023-11-14 ENCOUNTER — Encounter: Payer: Self-pay | Admitting: Family Medicine

## 2023-11-15 ENCOUNTER — Telehealth: Payer: Self-pay | Admitting: Family Medicine

## 2023-11-15 NOTE — Telephone Encounter (Signed)
 Patient has appointment on 7/28 for lipid follow up and wanting to know if he needed his labs done before appointment

## 2023-11-15 NOTE — Telephone Encounter (Signed)
 Please order lipid, liver, metabolic 7 Elevated serum creatinine, hyperlipidemia, high risk med Patient can do labs fasting yet very important to drink water  in the morning he gets up in order to be well-hydrated at the time of the blood work  The message makes mention that he has a follow-up on the 28th?  I do not see a follow-up currently he could be rescheduled for into August on a same-day slot if he would like to be seen otherwise we will do lab work and then go from there thank you

## 2023-11-18 ENCOUNTER — Other Ambulatory Visit: Payer: Self-pay

## 2023-11-18 ENCOUNTER — Ambulatory Visit: Payer: Medicare HMO | Admitting: Family Medicine

## 2023-11-18 DIAGNOSIS — Z79899 Other long term (current) drug therapy: Secondary | ICD-10-CM

## 2023-11-18 DIAGNOSIS — E7849 Other hyperlipidemia: Secondary | ICD-10-CM

## 2023-11-18 DIAGNOSIS — R7989 Other specified abnormal findings of blood chemistry: Secondary | ICD-10-CM

## 2023-11-18 NOTE — Telephone Encounter (Signed)
 The best I can tell this appointment was a 107-month follow-up from January patient is on thyroid  medicine cholesterol medicine  Labs have been ordered.  We can always set up a follow-up visit based on the results of the labs thank you

## 2023-11-19 ENCOUNTER — Telehealth: Payer: Self-pay | Admitting: Family Medicine

## 2023-11-19 NOTE — Telephone Encounter (Signed)
 Nurses I spoke with patient via phone regarding his upcoming lab work He let me know that his kidney doctor is doing similar blood work Therefore we only need to do the following   Lipid-hyperlipidemia Liver-high risk med  He will do this lab work late August please revise the lab work to reflect the above  Please mail him a copy of the lab requisition along with a reminder to do the lab work when he does blood work with the kidney doctor  Thanks-Dr. Glendia  Patient is aware

## 2023-11-20 ENCOUNTER — Other Ambulatory Visit: Payer: Self-pay

## 2023-11-20 DIAGNOSIS — E785 Hyperlipidemia, unspecified: Secondary | ICD-10-CM

## 2023-11-20 DIAGNOSIS — Z79899 Other long term (current) drug therapy: Secondary | ICD-10-CM

## 2023-11-22 ENCOUNTER — Telehealth: Payer: Self-pay | Admitting: Pharmacy Technician

## 2023-11-22 ENCOUNTER — Other Ambulatory Visit (HOSPITAL_COMMUNITY): Payer: Self-pay

## 2023-11-22 ENCOUNTER — Encounter: Payer: Self-pay | Admitting: Family Medicine

## 2023-11-22 ENCOUNTER — Other Ambulatory Visit: Payer: Self-pay | Admitting: Family Medicine

## 2023-11-22 MED ORDER — TADALAFIL 5 MG PO TABS: ORAL_TABLET | ORAL | 2 refills | Status: AC

## 2023-11-22 NOTE — Telephone Encounter (Signed)
 Pharmacy Patient Advocate Encounter   Received notification from Onbase that prior authorization for Tadalafil  5mg  tablets is required/requested.   Insurance verification completed.   The patient is insured through [employer] .   Per test claim:   Not covered for prescribed indication under Medicare Part D. Patient and pharmacy is aware that this is self pay.

## 2023-11-30 ENCOUNTER — Other Ambulatory Visit: Payer: Self-pay | Admitting: Family Medicine

## 2023-12-18 DIAGNOSIS — R911 Solitary pulmonary nodule: Secondary | ICD-10-CM | POA: Diagnosis not present

## 2023-12-18 DIAGNOSIS — R972 Elevated prostate specific antigen [PSA]: Secondary | ICD-10-CM | POA: Diagnosis not present

## 2023-12-18 DIAGNOSIS — G4733 Obstructive sleep apnea (adult) (pediatric): Secondary | ICD-10-CM | POA: Diagnosis not present

## 2023-12-18 DIAGNOSIS — R7303 Prediabetes: Secondary | ICD-10-CM | POA: Diagnosis not present

## 2023-12-18 DIAGNOSIS — N1831 Chronic kidney disease, stage 3a: Secondary | ICD-10-CM | POA: Diagnosis not present

## 2023-12-19 ENCOUNTER — Ambulatory Visit: Payer: Self-pay | Admitting: Family Medicine

## 2023-12-19 LAB — HEPATIC FUNCTION PANEL
ALT: 33 IU/L (ref 0–44)
AST: 31 IU/L (ref 0–40)
Albumin: 4.7 g/dL (ref 3.8–4.8)
Alkaline Phosphatase: 80 IU/L (ref 44–121)
Bilirubin Total: 0.7 mg/dL (ref 0.0–1.2)
Bilirubin, Direct: 0.23 mg/dL (ref 0.00–0.40)
Total Protein: 7.1 g/dL (ref 6.0–8.5)

## 2023-12-19 LAB — BASIC METABOLIC PANEL WITH GFR
BUN/Creatinine Ratio: 13 (ref 10–24)
BUN: 16 mg/dL (ref 8–27)
CO2: 23 mmol/L (ref 20–29)
Calcium: 9.9 mg/dL (ref 8.6–10.2)
Chloride: 101 mmol/L (ref 96–106)
Creatinine, Ser: 1.25 mg/dL (ref 0.76–1.27)
Glucose: 91 mg/dL (ref 70–99)
Potassium: 4.6 mmol/L (ref 3.5–5.2)
Sodium: 139 mmol/L (ref 134–144)
eGFR: 61 mL/min/1.73 (ref >59)

## 2023-12-19 LAB — LIPID PANEL
Chol/HDL Ratio: 2.7 ratio (ref 0.0–5.0)
Cholesterol, Total: 177 mg/dL (ref 100–199)
HDL: 65 mg/dL (ref >39)
LDL Chol Calc (NIH): 98 mg/dL (ref 0–99)
Triglycerides: 72 mg/dL (ref 0–149)
VLDL Cholesterol Cal: 14 mg/dL (ref 5–40)

## 2023-12-22 ENCOUNTER — Telehealth: Payer: Self-pay | Admitting: Family Medicine

## 2023-12-22 DIAGNOSIS — E785 Hyperlipidemia, unspecified: Secondary | ICD-10-CM

## 2023-12-22 DIAGNOSIS — R7989 Other specified abnormal findings of blood chemistry: Secondary | ICD-10-CM

## 2023-12-22 DIAGNOSIS — N289 Disorder of kidney and ureter, unspecified: Secondary | ICD-10-CM

## 2023-12-22 DIAGNOSIS — E038 Other specified hypothyroidism: Secondary | ICD-10-CM

## 2023-12-22 DIAGNOSIS — R972 Elevated prostate specific antigen [PSA]: Secondary | ICD-10-CM

## 2023-12-22 DIAGNOSIS — E7849 Other hyperlipidemia: Secondary | ICD-10-CM

## 2023-12-22 NOTE — Telephone Encounter (Signed)
 Nurses Please set up lab work for the patient to do in 2025 Also have the front go ahead and set him up for a wellness visit-I am managing my schedule is February currently for wellness His wellness does not need to be until late January February or early March thank you  Please make sure the front notifies the patient his appointment  Recommend metabolic 7, lipid, liver, TSH, free T4, urine micro protein, PSA Hyperlipidemia, renal insufficiency, hypothyroidism, elevated PSA

## 2023-12-24 NOTE — Telephone Encounter (Signed)
 Lab orders placed.  Please schedule appointment and let patient know to come in for labwork.

## 2024-01-10 DIAGNOSIS — G4733 Obstructive sleep apnea (adult) (pediatric): Secondary | ICD-10-CM | POA: Diagnosis not present

## 2024-01-10 DIAGNOSIS — N182 Chronic kidney disease, stage 2 (mild): Secondary | ICD-10-CM | POA: Diagnosis not present

## 2024-01-10 DIAGNOSIS — Z791 Long term (current) use of non-steroidal anti-inflammatories (NSAID): Secondary | ICD-10-CM | POA: Diagnosis not present

## 2024-01-10 DIAGNOSIS — R972 Elevated prostate specific antigen [PSA]: Secondary | ICD-10-CM | POA: Diagnosis not present

## 2024-01-20 ENCOUNTER — Other Ambulatory Visit: Payer: Self-pay | Admitting: Family Medicine

## 2024-02-04 ENCOUNTER — Telehealth: Payer: Self-pay | Admitting: Family Medicine

## 2024-02-04 NOTE — Telephone Encounter (Signed)
 Spoke with patient to schedule AWV- Patient Declined AWV does not see why it is needed since Dr Alphonsa does his physicals.SABRA

## 2024-02-19 ENCOUNTER — Other Ambulatory Visit: Payer: Self-pay | Admitting: Family Medicine

## 2024-03-10 ENCOUNTER — Other Ambulatory Visit: Payer: Self-pay | Admitting: Family Medicine

## 2024-04-21 ENCOUNTER — Other Ambulatory Visit: Payer: Self-pay | Admitting: Family Medicine

## 2024-04-29 ENCOUNTER — Encounter: Payer: Self-pay | Admitting: Family Medicine

## 2024-04-29 ENCOUNTER — Ambulatory Visit: Admitting: Family Medicine

## 2024-04-29 ENCOUNTER — Other Ambulatory Visit: Payer: Self-pay | Admitting: Family Medicine

## 2024-04-29 VITALS — BP 112/72 | HR 69 | Temp 97.7°F | Ht 67.5 in | Wt 166.6 lb

## 2024-04-29 DIAGNOSIS — J069 Acute upper respiratory infection, unspecified: Secondary | ICD-10-CM

## 2024-04-29 MED ORDER — TRIAMCINOLONE ACETONIDE 0.1 % EX CREA: 1.0000 | TOPICAL_CREAM | Freq: Two times a day (BID) | CUTANEOUS | 4 refills | Status: AC | PRN

## 2024-04-29 MED ORDER — AZITHROMYCIN 250 MG PO TABS: ORAL_TABLET | ORAL | 0 refills | Status: AC

## 2024-04-29 NOTE — Progress Notes (Signed)
" ° °  Subjective:    Patient ID: Dylan Chapman, male    DOB: Aug 17, 1950, 74 y.o.   MRN: [id number]  HPI  Sore throat, congestion with running nose when waking up but clears up during the day. Coughing in the even. Started several days ago head congestion drainage a little bit of sore throat denies high fever chill sweats no wheezing or difficulty breathing  Patient is going on a cruise and wants to make sure he is okay before leaving.  Refill requested for Kenalog . Patient gets intermittent rash under his arms that Kenalog  helps  Patient states he started taking of vitamin B 2 capsule from local pharmacist to try to help his headaches-it helped for a while but then after that it stopped helping Review of Systems     Objective:   Physical Exam Gen-NAD not toxic TMS-normal bilateral T- normal no redness Chest-CTA respiratory rate normal no crackles CV RRR no murmur Skin-warm dry Neuro-grossly normal  He does relate mucoid drainage Patient will do his labs before next follow-up visit     Assessment & Plan:  Patient was encouraged to go ahead with supportive measures More than likely this is viral Should take several days but then get better If progressive symptoms worse may need further treatment  Kenalog  refill  Finally patient was given a prescription for antibiotics to take with him just in case he gets worse while he is on the cruise warning signs were discussed in detail  "

## 2024-04-29 NOTE — Telephone Encounter (Signed)
 Pt appointment for Monday via patient request Appointment today Jan. 7th at 4:10

## 2024-04-29 NOTE — Addendum Note (Signed)
 Addended by: ALPHONSA HAMILTON A on: 04/29/2024 07:01 PM   Modules accepted: Orders

## 2024-05-04 ENCOUNTER — Ambulatory Visit: Admitting: Family Medicine

## 2024-05-12 ENCOUNTER — Other Ambulatory Visit: Payer: Self-pay | Admitting: *Deleted

## 2024-05-12 DIAGNOSIS — E785 Hyperlipidemia, unspecified: Secondary | ICD-10-CM

## 2024-05-12 DIAGNOSIS — E038 Other specified hypothyroidism: Secondary | ICD-10-CM

## 2024-05-12 DIAGNOSIS — R972 Elevated prostate specific antigen [PSA]: Secondary | ICD-10-CM

## 2024-05-12 DIAGNOSIS — E7849 Other hyperlipidemia: Secondary | ICD-10-CM

## 2024-05-12 DIAGNOSIS — N289 Disorder of kidney and ureter, unspecified: Secondary | ICD-10-CM

## 2024-05-12 DIAGNOSIS — R7989 Other specified abnormal findings of blood chemistry: Secondary | ICD-10-CM

## 2024-05-13 LAB — PSA: Prostate Specific Ag, Serum: 2.9 ng/mL (ref 0.0–4.0)

## 2024-05-13 LAB — BASIC METABOLIC PANEL WITH GFR
BUN/Creatinine Ratio: 16 (ref 10–24)
BUN: 20 mg/dL (ref 8–27)
CO2: 24 mmol/L (ref 20–29)
Calcium: 9.7 mg/dL (ref 8.6–10.2)
Chloride: 100 mmol/L (ref 96–106)
Creatinine, Ser: 1.25 mg/dL (ref 0.76–1.27)
Glucose: 86 mg/dL (ref 70–99)
Potassium: 4.1 mmol/L (ref 3.5–5.2)
Sodium: 140 mmol/L (ref 134–144)
eGFR: 61 mL/min/1.73

## 2024-05-13 LAB — HEPATIC FUNCTION PANEL
ALT: 59 IU/L — ABNORMAL HIGH (ref 0–44)
AST: 49 IU/L — ABNORMAL HIGH (ref 0–40)
Albumin: 4.5 g/dL (ref 3.8–4.8)
Alkaline Phosphatase: 80 IU/L (ref 47–123)
Bilirubin Total: 0.4 mg/dL (ref 0.0–1.2)
Bilirubin, Direct: 0.14 mg/dL (ref 0.00–0.40)
Total Protein: 7.3 g/dL (ref 6.0–8.5)

## 2024-05-13 LAB — MICROALBUMIN / CREATININE URINE RATIO
Creatinine, Urine: 14.9 mg/dL
Microalb/Creat Ratio: <20 mg/g{creat} (ref 0–29)
Microalbumin, Urine: <3 ug/mL

## 2024-05-13 LAB — LIPID PANEL
Chol/HDL Ratio: 2.3 ratio (ref 0.0–5.0)
Cholesterol, Total: 159 mg/dL (ref 100–199)
HDL: 70 mg/dL
LDL Chol Calc (NIH): 74 mg/dL (ref 0–99)
Triglycerides: 77 mg/dL (ref 0–149)
VLDL Cholesterol Cal: 15 mg/dL (ref 5–40)

## 2024-05-13 LAB — HEMOGLOBIN A1C
Est. average glucose Bld gHb Est-mCnc: 111 mg/dL
Hgb A1c MFr Bld: 5.5 % (ref 4.8–5.6)

## 2024-05-13 LAB — TSH: TSH: 5.64 u[IU]/mL — ABNORMAL HIGH (ref 0.450–4.500)

## 2024-05-13 LAB — T4, FREE: Free T4: 1.21 ng/dL (ref 0.82–1.77)

## 2024-05-14 ENCOUNTER — Ambulatory Visit: Payer: Self-pay | Admitting: Family Medicine

## 2024-05-18 ENCOUNTER — Encounter: Admitting: Family Medicine

## 2024-05-25 ENCOUNTER — Other Ambulatory Visit: Payer: Self-pay | Admitting: Family Medicine

## 2024-05-25 ENCOUNTER — Telehealth: Payer: Self-pay

## 2024-05-25 ENCOUNTER — Other Ambulatory Visit: Payer: Self-pay

## 2024-05-25 MED ORDER — LEVOTHYROXINE SODIUM 25 MCG PO TABS: ORAL_TABLET | ORAL | 0 refills | Status: AC

## 2024-05-25 NOTE — Telephone Encounter (Signed)
 Refill has been sent in.

## 2024-05-25 NOTE — Telephone Encounter (Signed)
 Prescription Request  05/25/2024  LOV: Visit date not found  What is the name of the medication or equipment? levothyroxine  (SYNTHROID ) 25 MCG tablet rosuvastatin  (CRESTOR ) 40 MG tablet   Have you contacted your pharmacy to request a refill? Yes   Which pharmacy would you like this sent to?   [facility]    Patient notified that their request is being sent to the clinical staff for review and that they should receive a response within 2 business days.   Please advise at Mobile [phone] (mobile)

## 2024-05-26 ENCOUNTER — Other Ambulatory Visit (HOSPITAL_COMMUNITY): Payer: Self-pay

## 2024-05-26 ENCOUNTER — Encounter: Payer: Self-pay | Admitting: Family Medicine

## 2024-05-26 ENCOUNTER — Telehealth: Payer: Self-pay | Admitting: Family Medicine

## 2024-05-26 NOTE — Telephone Encounter (Signed)
 Nurses Please forward this message to pharmacy team that does prior approvals Thank you-Dr. Glendia

## 2024-05-27 ENCOUNTER — Telehealth: Payer: Self-pay | Admitting: Family Medicine

## 2024-05-27 MED ORDER — FAMOTIDINE 40 MG PO TABS: 40.0000 mg | ORAL_TABLET | Freq: Every day | ORAL | 0 refills | Status: AC

## 2024-05-27 MED ORDER — EZETIMIBE 10 MG PO TABS: 10.0000 mg | ORAL_TABLET | Freq: Every day | ORAL | 0 refills | Status: AC

## 2024-05-27 MED ORDER — ROSUVASTATIN CALCIUM 40 MG PO TABS: 40.0000 mg | ORAL_TABLET | Freq: Every day | ORAL | 3 refills | Status: AC

## 2024-05-27 NOTE — Telephone Encounter (Signed)
 Refill    ezetimibe  (ZETIA ) 10 MG tablet    famotidine  (PEPCID ) 40 MG tablet    rosuvastatin  (CRESTOR ) 40 MG tablet   [facility]

## 2024-05-27 NOTE — Telephone Encounter (Signed)
 Completed appeal and faxed

## 2024-05-27 NOTE — Telephone Encounter (Signed)
 Autumn-so what is amazing (clinical pharmacist included as RICK) The initial paperwork from [facility] was faxed to us  on 2 February at 12:42 PM This particular form stated toward the bottom if we do not receive this information by 05/26/2024 5:13 AM central standard time we will issue a determination  Seeing as I saw this form on 05/26/2024-office was closed on 05/25/2024 There is absolutely no way the form could be filled out This is a purposeful way that [facility] can declare that they never heard anything and therefore they are denying it when in fact it was unrealistic and purposely unfair  I filled out the additional forms Please fill in the administrative portion Please fax both to [facility]  [employer] company being disingenuous) I will send the patient a MyChart message I am not expecting that this will get approved partly because [facility] is trying to pinch pennies

## 2024-06-01 ENCOUNTER — Encounter: Admitting: Family Medicine
